# Patient Record
Sex: Female | Born: 1965 | Race: White | Hispanic: No | Marital: Married | Smoking: Never smoker
Health system: Southern US, Community
[De-identification: ages and names within clinical notes are randomized; demographics above are authoritative.]

## PROBLEM LIST (undated history)

## (undated) DIAGNOSIS — M1990 Unspecified osteoarthritis, unspecified site: Secondary | ICD-10-CM

## (undated) DIAGNOSIS — K219 Gastro-esophageal reflux disease without esophagitis: Secondary | ICD-10-CM

## (undated) DIAGNOSIS — J45909 Unspecified asthma, uncomplicated: Secondary | ICD-10-CM

## (undated) DIAGNOSIS — R51 Headache: Secondary | ICD-10-CM

## (undated) DIAGNOSIS — I10 Essential (primary) hypertension: Secondary | ICD-10-CM

## (undated) DIAGNOSIS — F419 Anxiety disorder, unspecified: Secondary | ICD-10-CM

## (undated) DIAGNOSIS — F329 Major depressive disorder, single episode, unspecified: Secondary | ICD-10-CM

## (undated) DIAGNOSIS — F32A Depression, unspecified: Secondary | ICD-10-CM

## (undated) HISTORY — PX: BREAST ENHANCEMENT SURGERY: SHX7

## (undated) HISTORY — PX: TONSILLECTOMY: SUR1361

## (undated) HISTORY — PX: DILATION AND CURETTAGE OF UTERUS: SHX78

## (undated) HISTORY — PX: COLONOSCOPY: SHX174

## (undated) HISTORY — PX: CLAVICLE SURGERY: SHX598

---

## 1997-08-17 ENCOUNTER — Inpatient Hospital Stay (HOSPITAL_COMMUNITY): Admission: AD | Admit: 1997-08-17 | Discharge: 1997-08-19 | Payer: Self-pay | Admitting: Obstetrics and Gynecology

## 1997-08-20 ENCOUNTER — Encounter: Admission: RE | Admit: 1997-08-20 | Discharge: 1997-11-18 | Payer: Self-pay | Admitting: Obstetrics and Gynecology

## 1997-10-04 ENCOUNTER — Other Ambulatory Visit: Admission: RE | Admit: 1997-10-04 | Discharge: 1997-10-04 | Payer: Self-pay | Admitting: Obstetrics and Gynecology

## 1998-10-10 ENCOUNTER — Other Ambulatory Visit: Admission: RE | Admit: 1998-10-10 | Discharge: 1998-10-10 | Payer: Self-pay | Admitting: Obstetrics and Gynecology

## 1999-05-25 ENCOUNTER — Ambulatory Visit (HOSPITAL_COMMUNITY): Admission: RE | Admit: 1999-05-25 | Discharge: 1999-05-25 | Payer: Self-pay | Admitting: Family Medicine

## 1999-05-25 ENCOUNTER — Encounter: Payer: Self-pay | Admitting: Family Medicine

## 1999-12-11 ENCOUNTER — Other Ambulatory Visit: Admission: RE | Admit: 1999-12-11 | Discharge: 1999-12-11 | Payer: Self-pay | Admitting: Obstetrics and Gynecology

## 2001-01-08 ENCOUNTER — Other Ambulatory Visit: Admission: RE | Admit: 2001-01-08 | Discharge: 2001-01-08 | Payer: Self-pay | Admitting: Obstetrics and Gynecology

## 2001-05-17 ENCOUNTER — Encounter (INDEPENDENT_AMBULATORY_CARE_PROVIDER_SITE_OTHER): Payer: Self-pay | Admitting: Specialist

## 2001-05-17 ENCOUNTER — Ambulatory Visit (HOSPITAL_COMMUNITY): Admission: RE | Admit: 2001-05-17 | Discharge: 2001-05-17 | Payer: Self-pay | Admitting: Gastroenterology

## 2001-09-30 ENCOUNTER — Encounter: Admission: RE | Admit: 2001-09-30 | Discharge: 2001-09-30 | Payer: Self-pay | Admitting: Family Medicine

## 2001-09-30 ENCOUNTER — Encounter: Payer: Self-pay | Admitting: Family Medicine

## 2002-03-11 ENCOUNTER — Other Ambulatory Visit: Admission: RE | Admit: 2002-03-11 | Discharge: 2002-03-11 | Payer: Self-pay | Admitting: Obstetrics and Gynecology

## 2003-04-11 ENCOUNTER — Other Ambulatory Visit: Admission: RE | Admit: 2003-04-11 | Discharge: 2003-04-11 | Payer: Self-pay | Admitting: Obstetrics and Gynecology

## 2005-01-24 ENCOUNTER — Other Ambulatory Visit: Admission: RE | Admit: 2005-01-24 | Discharge: 2005-01-24 | Payer: Self-pay | Admitting: Obstetrics and Gynecology

## 2005-04-04 ENCOUNTER — Encounter: Admission: RE | Admit: 2005-04-04 | Discharge: 2005-04-04 | Payer: Self-pay | Admitting: Family Medicine

## 2006-12-30 ENCOUNTER — Encounter: Admission: RE | Admit: 2006-12-30 | Discharge: 2006-12-30 | Payer: Self-pay | Admitting: Physician Assistant

## 2007-02-08 ENCOUNTER — Ambulatory Visit: Payer: Self-pay | Admitting: Internal Medicine

## 2007-02-08 LAB — CONVERTED CEMR LAB
BUN: 10 mg/dL (ref 6–23)
Basophils Absolute: 0.1 10*3/uL (ref 0.0–0.1)
Basophils Relative: 0.9 % (ref 0.0–1.0)
CO2: 26 meq/L (ref 19–32)
Calcium: 9 mg/dL (ref 8.4–10.5)
Chloride: 102 meq/L (ref 96–112)
Creatinine, Ser: 0.8 mg/dL (ref 0.4–1.2)
Eosinophils Absolute: 0.1 10*3/uL (ref 0.0–0.6)
Eosinophils Relative: 1 % (ref 0.0–5.0)
GFR calc Af Amer: 102 mL/min
GFR calc non Af Amer: 84 mL/min
Glucose, Bld: 91 mg/dL (ref 70–99)
HCT: 39.2 % (ref 36.0–46.0)
Hemoglobin: 13.8 g/dL (ref 12.0–15.0)
Lymphocytes Relative: 22.1 % (ref 12.0–46.0)
MCHC: 35.2 g/dL (ref 30.0–36.0)
MCV: 93 fL (ref 78.0–100.0)
Monocytes Absolute: 0.7 10*3/uL (ref 0.2–0.7)
Monocytes Relative: 5.9 % (ref 3.0–11.0)
Neutro Abs: 8.8 10*3/uL — ABNORMAL HIGH (ref 1.4–7.7)
Neutrophils Relative %: 70.1 % (ref 43.0–77.0)
Platelets: 321 10*3/uL (ref 150–400)
Potassium: 4.1 meq/L (ref 3.5–5.1)
RBC: 4.22 M/uL (ref 3.87–5.11)
RDW: 12 % (ref 11.5–14.6)
Sodium: 138 meq/L (ref 135–145)
TSH: 2.11 microintl units/mL (ref 0.35–5.50)
WBC: 12.4 10*3/uL — ABNORMAL HIGH (ref 4.5–10.5)

## 2007-02-09 ENCOUNTER — Ambulatory Visit: Payer: Self-pay | Admitting: Cardiology

## 2007-02-19 ENCOUNTER — Ambulatory Visit: Payer: Self-pay | Admitting: Pulmonary Disease

## 2007-02-19 ENCOUNTER — Ambulatory Visit: Admission: RE | Admit: 2007-02-19 | Discharge: 2007-02-19 | Payer: Self-pay | Admitting: Internal Medicine

## 2007-03-04 ENCOUNTER — Ambulatory Visit (HOSPITAL_COMMUNITY): Admission: RE | Admit: 2007-03-04 | Discharge: 2007-03-04 | Payer: Self-pay | Admitting: Internal Medicine

## 2007-03-18 ENCOUNTER — Ambulatory Visit: Payer: Self-pay | Admitting: Internal Medicine

## 2009-04-05 IMAGING — CR DG CHEST 2V
2 series · 2 of 2 positions shown · non-contrast
Comparison: None.

CLINICAL DATA: Shortness of breath with exertion for two months.  
 CHEST, TWO VIEWS:

[w chest pa]
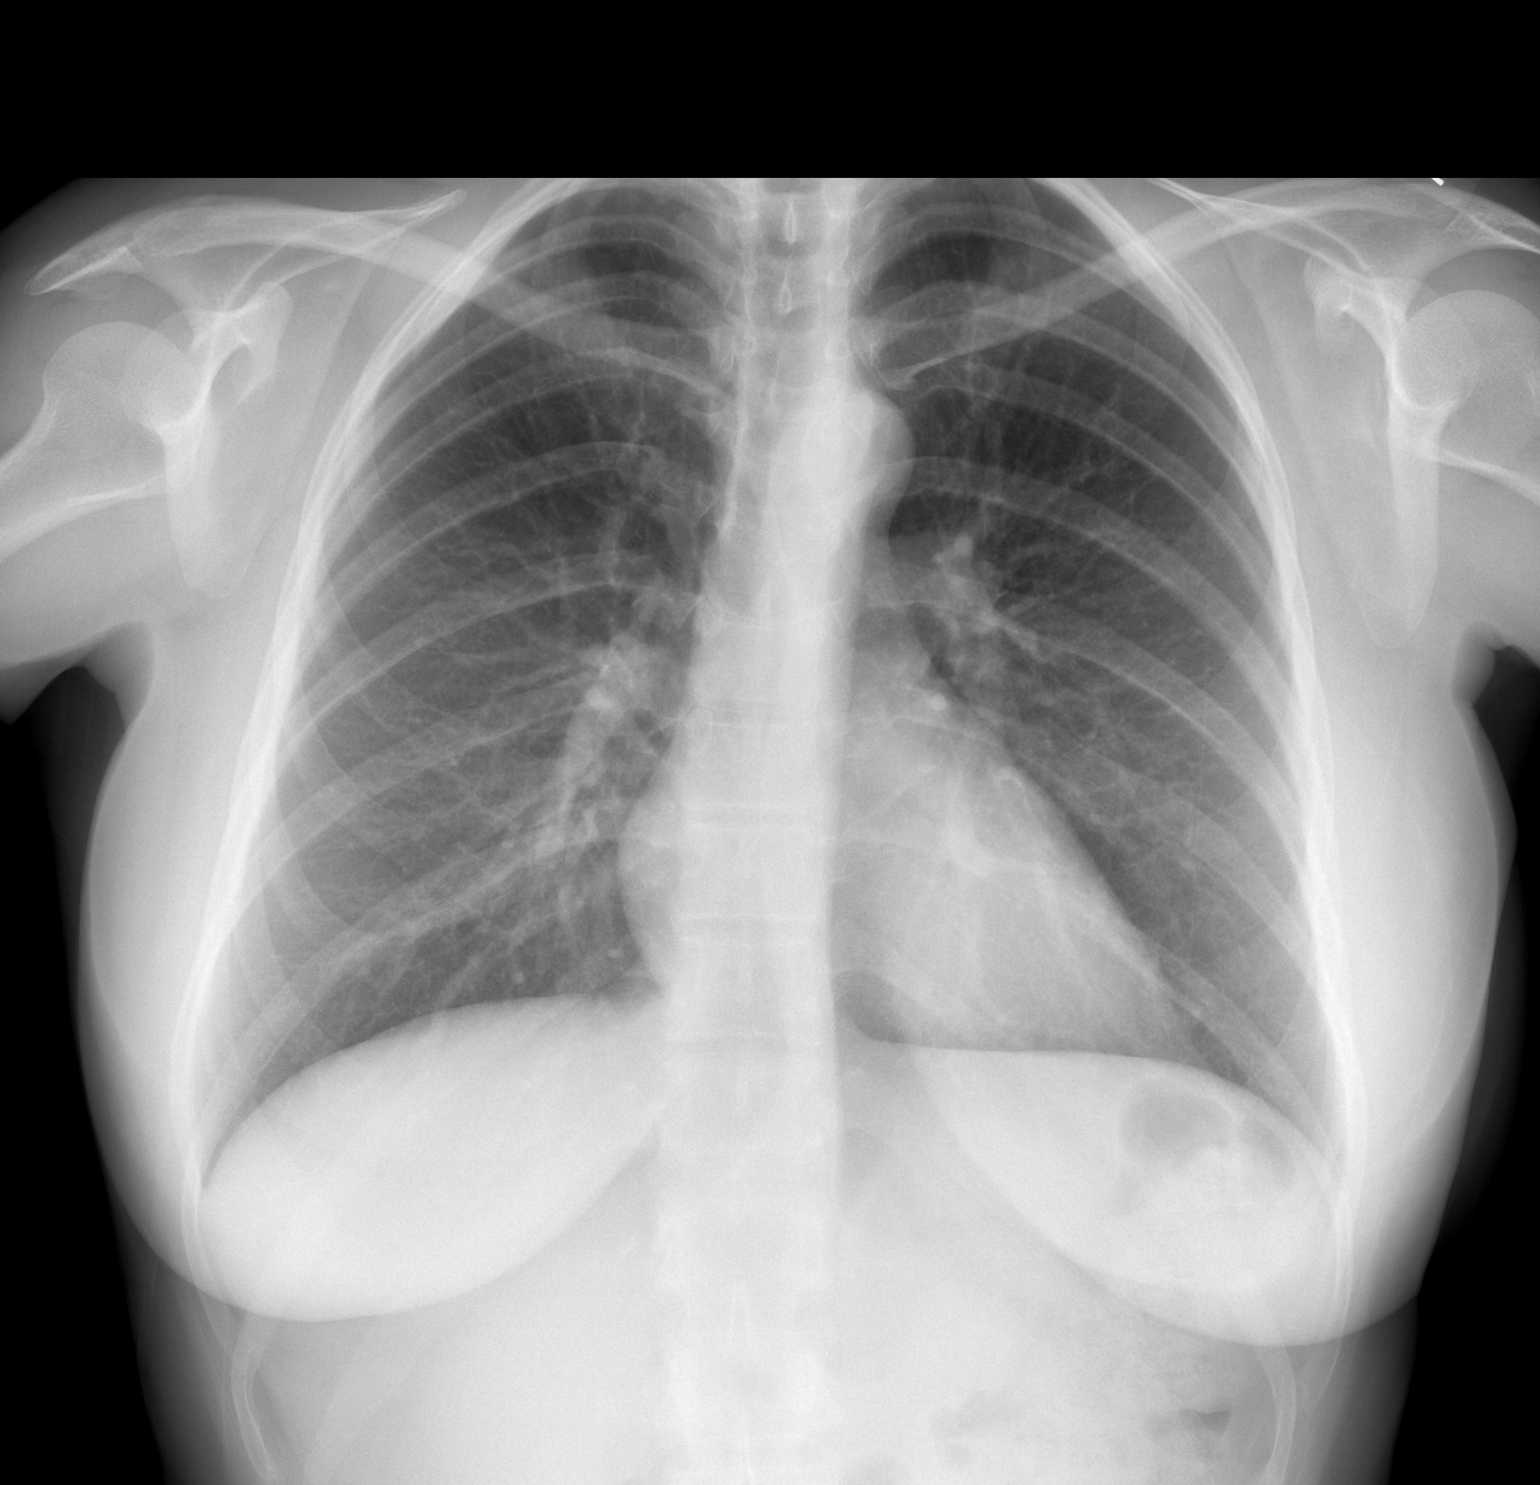

[w chest lat]
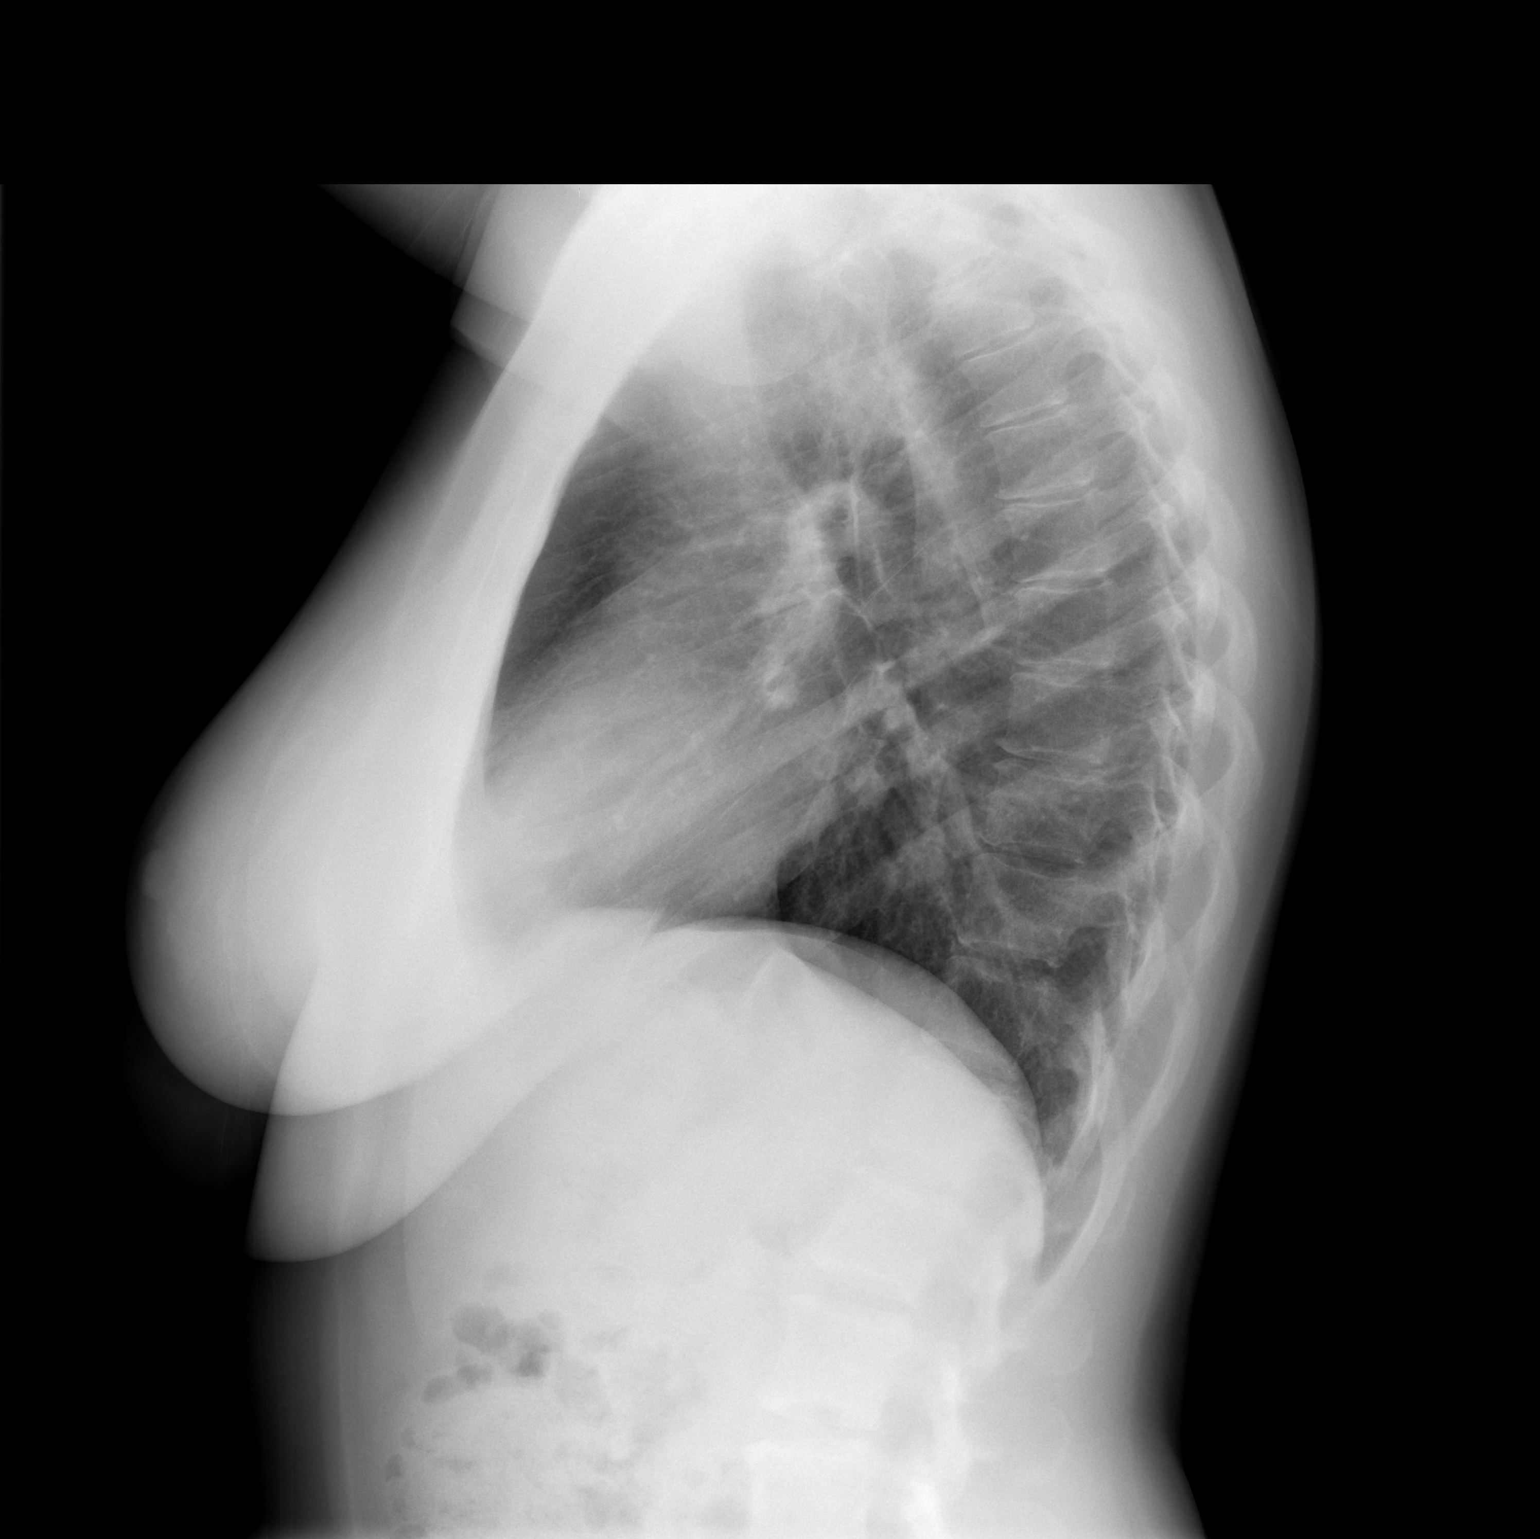

[2 of 2 positions shown; findings below may reference images not displayed]

FINDINGS: The heart size and mediastinal contours are unremarkable.  The lungs are clear.  The visualized skeleton is unremarkable.
IMPRESSION: No active disease.

## 2009-10-29 ENCOUNTER — Telehealth (INDEPENDENT_AMBULATORY_CARE_PROVIDER_SITE_OTHER): Payer: Self-pay | Admitting: *Deleted

## 2010-08-01 NOTE — Progress Notes (Signed)
  Phone Note Call from Patient   Caller: Patient Action Taken: Phone Call Completed Details of Action Taken: Printed EMR records Summary of Call: Patient called today requesting that we complete an [employer] for her by tomorrow. After speaking with Cleda Mccreedy advised the patient that [facility] would copy her records for her and Pam would certify that the copies are true and accurate.  Initial call taken by: Neal Dy, [facility] Representative

## 2010-11-12 NOTE — Assessment & Plan Note (Signed)
[facility]                             PULMONARY OFFICE NOTE   NAME:WEAVERAshleymarie, Sloan                       MRN:          [id number]  DATE:03/18/2007                            DOB:          [birth year]    HISTORY:  This is a 45 year old female who we initially saw for  progressive dyspea to the point where she couldn't get her breath  mopping the floor.  She tells me today she actually has paroxysms of  dyspnea that occur at rest as well, and that she has not slept for  years well, but not due to any breathing problems.  All of this is new  information to me today but had led to a workup initially directed at  the complaint of reproducible poor exercise tolerance for which we did  an exercise tolerance test which was perfectly normal.  However,  apparently it did not reproduce her symptoms that she can't get a deep  breath.  That led in turn to a methacholine challenge test that was  done off her propranolol and specific beta blockers which interestingly  was positive for decrease in airflow with methacholine but did not bring  on any of the symptoms that she has been experiencing.  It did make her  feel bad, however, and after she received albuterol she felt no  better until she went home and went to sleep.   She returns today despondent that we have not identified and explanation  for her chronic fatigue and dyspnea.  She can even be short of breath  going up steps, otherwise is on her feet all day without problems.  Note that the history has changed quite a bit.  Unfortunately so has the  list of medicines which now she tells me for the first time includes  Amitriptyllline at some dose at bedtime.  She is tapering bisoprolol  off because she does not think she needs it.   PHYSICAL EXAMINATION:  She is a depressed, anxious white female in no  acute distress.  Stable vital signs.  She has very unusual way of  answering questions without actually  answering them.  HEENT:  Unremarkable.  LUNGS:  Clear bilateral to percussion.  CARDIAC:  Reveals a regular rhythm without murmurs, rubs or gallops.  ABDOMEN:  Soft.  EXTREMITIES:  Warm, without calf tenderness, sinus, or clubbing.   IMPRESSION:  1. She has excellent exercise tolerance which excludes cardiac or      pulmonary dysfunction from explaining any of her dyspnea going up      steps, or mopping the floor.  I believe this is functional and      related to deconditioning as well as poor sleep hygiene and I      recommended not only a sleep hygiene sheet (which I reviewed with      her), but also the concept that she should exercise at a level      where is short of breath but not out of breath for 2 full weeks      before considering any additional workup because  this may actually      solve her problem.  2. The paroxysms of dyspnea at rest initially might indicate asthma,      but note that we were not able to reproduce her symptoms with a      methacholine challenge test, even though technically it was      positive.  This means that she is susceptible to asthma and should      not be placed on high doses of beta blockers, start smoking      cigarettes or have significant allergy exposures.  Since none of      these apply, I do not believe she needs to be treated for asthma,      but I certainly would not use high doses of nonspecific beta      blockers here (ie propranolol is contraindicated).   I do not believe the patient was happy with the evaluation that we have  done and have referred her back to Dr. Clovis Riley for further evaluation  of chronic fatigue, noting that she has a definite problem with sleep  hygiene, and until she solves this it is unlikely that the daytime  function will improve.   Parenthetically, I would also note that we did a full lab profile  including TSH, CBC, chemistry profile that were normal on her initial  workup.     Charlaine Dalton. Sherene Sires, MD,  [facility]  Electronically Signed    MBW/MedQ  DD: 03/18/2007  DT: 03/18/2007  Job #: [id number]   cc:   L. Lupe Carney, M.D.

## 2010-11-12 NOTE — Op Note (Signed)
NAME:  Victoria Sloan, Victoria Sloan NO.:  [id number]   MEDICAL RECORD NO.:  [id number]          PATIENT TYPE:  OUT   LOCATION:  CARD                         FACILITY:  [facility]   PHYSICIAN:  Oley Balm. Sung Amabile, MD   DATE OF BIRTH:  [birth year]   DATE OF PROCEDURE:  02/19/2007  DATE OF DISCHARGE:  02/19/2007                               OPERATIVE REPORT   CARDIOPULMONARY STRESS TEST:   INDICATION FOR TESTING:  Unexplained dyspnea.   PROCEDURE:  Cardiopulmonary stress testing was performed on a graded  treadmill.  Testing was stopped due to fatigue and head and neck pain.  Effort was maximal.  At peak exercise oxygen uptake was 1.72 L/min. or  85% of predicted maximum, indicating that indicating low normal exercise  tolerance.   At peak exercise heart rate was 160 beats per minute or 89% of  predicted, indicating that cardiovascular limitation was reached.  Oxygen pulse was normal, suggesting normal stroke volume.  Blood  pressure response was normal.  EKG tracings revealed no arrhythmias and  no definite ischemia.   At peak exercise minute ventilation was 66.9 L/min. or 64% of predicted  maximum, indicating that ventilatory reserve remained.  Gas exchange  parameters revealed no abnormalities.  Baseline spirometry revealed no  abnormalities.  Postexercise spirometry revealed an 18% reduction in FEF  25-75%, consistent with possible mild exercise-induced bronchospasm.   SUMMARY:  Low normal exercise tolerance.  Normal cardiopulmonary  response to exercise.  Possible mild exercise-induced bronchospasm.  Consider a trial of bronchodilators if one has not already been  undertaken.      Oley Balm Sung Amabile, MD  Electronically Signed     DBS/MEDQ  D:  03/16/2007  T:  03/17/2007  Job:  [id number]   cc:   Charlaine Dalton. Sherene Sires, MD, FCCP  [address]  [place] [address]   [facility]

## 2010-11-12 NOTE — Assessment & Plan Note (Signed)
[facility]                             PULMONARY OFFICE NOTE   NAME:WEAVERPrapti, Grussing                       MRN:          [id number]  DATE:02/08/2007                            DOB:          04-24-1966    HISTORY:  A 45 year old white female, occasional social smoker only,  with new-onset dyspnea over the last 3 to 4 months, mostly occurring  with exertion, gradually worse to the point where she cannot mop a floor  any more.  She was tried on Advair, which she said helped some,  Singulair that did not help at all.  She has noticed mild nasal  congestion with these symptoms, but no pleuritic pain, fevers, chills,  sweats, orthopnea, PND, or leg swelling.  Denies any symptoms have ever  occurred with sleeping.  She does have occasional sensation that she  just can't get a deep breath, but this is terminated after a single  breath with relaxing.  Typically, she recovers within 1 to 2 minutes of  sitting still.  Denies any subjective wheezing.  No history of DVT or  calf injury, or swelling.   PAST MEDICAL HISTORY:  Significant for normal child birth x2, the last 9  months ago.  Hypertension.  Migraines.   ALLERGIES:  None known.   MEDICATIONS:  Effexor.  Birth control pills.  Propranolol for both  migraines and headache.  She did have recent antibiotics, does not think  it was Macrobid.   SOCIAL HISTORY:  She smoked only socially, but quit 2 to 3 years ago and  actually never smoked much at all.  She manages a [employer], political [employer].   FAMILY HISTORY:  Negative for clotting disorders or asthma.   REVIEW OF SYSTEMS:  Taken in detail on the worksheet and negative,  except as outlined above.   PHYSICAL EXAMINATION:  This is a very pleasant, ambulatory, minimally  obese white female in no acute distress with relatively thin  extremities.  Weight is 144 pounds.  HEENT:  Unremarkable.  Oropharynx clear.  NECK:  Supple without cervical adenopathy or tenderness.   Trachea is  midline with no thyromegaly.  LUNGS:  The lung fields are perfectly clear bilaterally to auscultation  and percussion.  CARDIAC:  Regular rate and rhythm without murmur, gallop, or rub.  She  had a regular rate and rhythm with no increase in the pulmonic component  of S2.  ABDOMEN:  Soft and benign.  EXTREMITIES:  Warm without calf tenderness, cyanosis, clubbing, or  edema.   LABS:  She walked around the office for 1 lap, 185 feet, before  desaturating to 88% with a pulse rate of 104.   Chest x-ray was felt to be normal.   IMPRESSION:  New onset exertional dyspnea associated with hypoxemia  indicating either occult thromboembolic disease or interstitial lung  disease.  Note, she has had a history of urinary tract infection, but  denies ever being exposed to Macrobid.  She is on birth control pills at  age 49 and, therefore, occult thromboembolic disease would be a leading  suspect in the differential diagnosis.  I, therefore, recommended  proceeding as soon as possible with a spiral CT and also will obtain a  baseline CBC with differential and D-dimer today, as well as TSH to  workup unexplained dyspnea, and BMET (for bicarb level) to evaluate  unexplained dyspnea on exertion.   Propranolol could be causing her to be asthmatic, but that would not  cause her to desaturate.  I have recommended a substitute Bystolic 5 mg  daily and perform a methacholine challenge test only after she has  proven to have a normal CT scan of the chest.   She tells me that her migraines have been bad off and on propranolol, so  it may well be that an alternative choice form propranolol could be  considered, but I have told her it may well be that propranolol is  helping her headaches, and that she will have a flare-up off of Inderal  and on Bystolic 5 mg daily (the reason to start the Bystolic now is so  that the methacholine challenge test is not falsely positive because of  the  nonspecific beta blocker of propranolol).     Charlaine Dalton. Sherene Sires, MD, [facility]  Electronically Signed    MBW/MedQ  DD: 02/08/2007  DT: 02/09/2007  Job #: [id number]   cc:   L. Lupe Carney, M.D.  Milus Height, MD  [facility]. Marcelle Overlie, M.D.

## 2010-11-15 NOTE — Procedures (Signed)
[place]  Patient:    NOELLY, LASSEIGNE Visit Number: [id number] MRN: [id number]          Service Type: END Location: ENDO Attending Physician:  Rich Brave Dictated by:   Florencia Reasons, M.D. Proc. Date: 05/17/01 Admit Date:  05/17/2001   CC:         [facility] Salvia. Marcelle Overlie, M.D.   Procedure Report  PROCEDURE:  Colonoscopy with polypectomy.  INDICATIONS FOR PROCEDURE:  A 45 year old female with family history of colon cancer in her mother at age 58, also family history of colon cancer in a maternal uncle.  FINDINGS:  Small polyp removed from the hepatic flexure region.  CONSENT:  The nature, purpose and risks of the procedure had been discussed with the patient who provided written consent.  SEDATION:  Phentanyl 145 mcg and Versed 15 mg IV without arrhythmias or desaturation.  DESCRIPTION OF PROCEDURE:  The Olympus adjustable tension pediatric video colonoscope was advanced with slight difficulty due to looping, but once the loop was removed, it advanced quite easily.  The remainder of the distance to the cecum, as identified by visualization of the appendiceal orifice, and pull back was then performed.  On the way in, I encountered a sessile 3 x 6 mm polyp which I biopsied a couple of times and then snared off.  When completed, most if not all of the tissue appeared to have been devitalized.  Only scant fragments were retrieved for histologic analysis, however, since it appeared the remainder of the tissue had been essentially fulgurated away.  No other polyps were seen during this exam, and there was no evidence of cancer, colitis, vascular malformations, or diverticular disease. Retroflexion was not performed in the rectum due to a small rectal ampulla. The quality of the prep was very good, except in the cecum itself where there was a stool film which was quite adherent and coated most of the cecal surface area, but it  is not felt that any significant lesions would have been missed.  The patient tolerated the procedure well, and there were no apparent complications.  IMPRESSION:  Small polyp near hepatic flexure, otherwise unremarkable exam.  PLAN:  Await pathology. Dictated by:   Florencia Reasons, M.D. Attending Physician:  Rich Brave DD:  05/17/01 TD:  05/17/01 Job: [id number]

## 2012-03-16 ENCOUNTER — Encounter (HOSPITAL_BASED_OUTPATIENT_CLINIC_OR_DEPARTMENT_OTHER): Payer: Self-pay | Admitting: *Deleted

## 2012-03-16 NOTE — Progress Notes (Signed)
To come in for bmet and ekg-saw dr wert years ago for ? Exercise induced asthma-states no problems anymore

## 2012-03-17 ENCOUNTER — Encounter (HOSPITAL_BASED_OUTPATIENT_CLINIC_OR_DEPARTMENT_OTHER)
Admission: RE | Admit: 2012-03-17 | Discharge: 2012-03-17 | Disposition: A | Discharge status: Home / Self Care | Payer: Medicaid Other | Source: Ambulatory Visit | Attending: Orthopedic Surgery | Admitting: Orthopedic Surgery

## 2012-03-17 LAB — BASIC METABOLIC PANEL
BUN: 13 mg/dL (ref 6–23)
Chloride: 99 mEq/L (ref 96–112)
Glucose, Bld: 92 mg/dL (ref 70–99)
Potassium: 3.8 mEq/L (ref 3.5–5.1)

## 2012-03-19 ENCOUNTER — Ambulatory Visit (HOSPITAL_BASED_OUTPATIENT_CLINIC_OR_DEPARTMENT_OTHER)
Admission: RE | Admit: 2012-03-19 | Discharge: 2012-03-19 | Disposition: A | Discharge status: Home / Self Care | Payer: Medicaid Other | Source: Ambulatory Visit | Attending: Orthopedic Surgery | Admitting: Orthopedic Surgery

## 2012-03-19 ENCOUNTER — Encounter (HOSPITAL_BASED_OUTPATIENT_CLINIC_OR_DEPARTMENT_OTHER): Payer: Self-pay | Admitting: *Deleted

## 2012-03-19 ENCOUNTER — Encounter (HOSPITAL_BASED_OUTPATIENT_CLINIC_OR_DEPARTMENT_OTHER): Payer: Self-pay | Admitting: Anesthesiology

## 2012-03-19 ENCOUNTER — Encounter (HOSPITAL_BASED_OUTPATIENT_CLINIC_OR_DEPARTMENT_OTHER)
Admission: RE | Disposition: A | Discharge status: Home / Self Care | Payer: Self-pay | Source: Ambulatory Visit | Attending: Orthopedic Surgery

## 2012-03-19 ENCOUNTER — Ambulatory Visit (HOSPITAL_BASED_OUTPATIENT_CLINIC_OR_DEPARTMENT_OTHER): Payer: Medicaid Other | Admitting: Anesthesiology

## 2012-03-19 DIAGNOSIS — Z01812 Encounter for preprocedural laboratory examination: Secondary | ICD-10-CM | POA: Insufficient documentation

## 2012-03-19 DIAGNOSIS — M19019 Primary osteoarthritis, unspecified shoulder: Secondary | ICD-10-CM | POA: Insufficient documentation

## 2012-03-19 DIAGNOSIS — K219 Gastro-esophageal reflux disease without esophagitis: Secondary | ICD-10-CM | POA: Insufficient documentation

## 2012-03-19 DIAGNOSIS — M25819 Other specified joint disorders, unspecified shoulder: Secondary | ICD-10-CM | POA: Insufficient documentation

## 2012-03-19 DIAGNOSIS — I10 Essential (primary) hypertension: Secondary | ICD-10-CM | POA: Insufficient documentation

## 2012-03-19 DIAGNOSIS — J45909 Unspecified asthma, uncomplicated: Secondary | ICD-10-CM | POA: Insufficient documentation

## 2012-03-19 HISTORY — DX: Unspecified osteoarthritis, unspecified site: M19.90

## 2012-03-19 HISTORY — PX: SHOULDER ARTHROSCOPY: SHX128

## 2012-03-19 HISTORY — DX: Unspecified asthma, uncomplicated: J45.909

## 2012-03-19 HISTORY — DX: Gastro-esophageal reflux disease without esophagitis: K21.9

## 2012-03-19 HISTORY — DX: Depression, unspecified: F32.A

## 2012-03-19 HISTORY — DX: Headache: R51

## 2012-03-19 HISTORY — DX: Major depressive disorder, single episode, unspecified: F32.9

## 2012-03-19 HISTORY — DX: Essential (primary) hypertension: I10

## 2012-03-19 HISTORY — DX: Anxiety disorder, unspecified: F41.9

## 2012-03-19 SURGERY — ARTHROSCOPY, SHOULDER
Anesthesia: General | Site: Shoulder | Laterality: Left | Wound class: Clean

## 2012-03-19 MED ORDER — MEPERIDINE HCL 25 MG/ML IJ SOLN: 6.2500 mg | INTRAMUSCULAR | Status: DC | PRN

## 2012-03-19 MED ORDER — SUCCINYLCHOLINE CHLORIDE 20 MG/ML IJ SOLN
INTRAMUSCULAR | Status: DC | PRN
  Administered 2012-03-19: 100 mg via INTRAVENOUS

## 2012-03-19 MED ORDER — OXYCODONE HCL 5 MG PO TABS
5.0000 mg | ORAL_TABLET | Freq: Once | ORAL | Status: AC | PRN
  Administered 2012-03-19: 5 mg via ORAL

## 2012-03-19 MED ORDER — DEXAMETHASONE SODIUM PHOSPHATE 4 MG/ML IJ SOLN
INTRAMUSCULAR | Status: DC | PRN
  Administered 2012-03-19: 10 mg via INTRAVENOUS

## 2012-03-19 MED ORDER — ROPIVACAINE HCL 5 MG/ML IJ SOLN
INTRAMUSCULAR | Status: DC | PRN
  Administered 2012-03-19: 25 mL via EPIDURAL

## 2012-03-19 MED ORDER — SODIUM CHLORIDE 0.9 % IR SOLN
Status: DC | PRN
  Administered 2012-03-19: 3000 mL

## 2012-03-19 MED ORDER — FENTANYL CITRATE 0.05 MG/ML IJ SOLN
50.0000 ug | Freq: Once | INTRAMUSCULAR | Status: AC
  Administered 2012-03-19: 100 ug via INTRAVENOUS

## 2012-03-19 MED ORDER — KETOROLAC TROMETHAMINE 30 MG/ML IJ SOLN
INTRAMUSCULAR | Status: DC | PRN
  Administered 2012-03-19: 30 mg via INTRAVENOUS

## 2012-03-19 MED ORDER — LIDOCAINE HCL (CARDIAC) 10 MG/ML IV SOLN
INTRAVENOUS | Status: DC | PRN
  Administered 2012-03-19: 50 mg via INTRAVENOUS

## 2012-03-19 MED ORDER — OXYCODONE HCL 5 MG/5ML PO SOLN: 5.0000 mg | Freq: Once | ORAL | Status: AC | PRN

## 2012-03-19 MED ORDER — MIDAZOLAM HCL 2 MG/2ML IJ SOLN
1.0000 mg | INTRAMUSCULAR | Status: DC | PRN
  Administered 2012-03-19: 2 mg via INTRAVENOUS

## 2012-03-19 MED ORDER — PROMETHAZINE HCL 25 MG/ML IJ SOLN: 6.2500 mg | INTRAMUSCULAR | Status: DC | PRN

## 2012-03-19 MED ORDER — LACTATED RINGERS IV SOLN
INTRAVENOUS | Status: DC
  Administered 2012-03-19 (×2): via INTRAVENOUS

## 2012-03-19 MED ORDER — OXYCODONE-ACETAMINOPHEN 5-325 MG PO TABS: 1.0000 | ORAL_TABLET | Freq: Four times a day (QID) | ORAL | Status: DC | PRN

## 2012-03-19 MED ORDER — PROPOFOL 10 MG/ML IV BOLUS
INTRAVENOUS | Status: DC | PRN
  Administered 2012-03-19: 200 mg via INTRAVENOUS
  Administered 2012-03-19: 20 mg via INTRAVENOUS

## 2012-03-19 MED ORDER — CEFAZOLIN SODIUM 1-5 GM-% IV SOLN
INTRAVENOUS | Status: DC | PRN
  Administered 2012-03-19: 2 g via INTRAVENOUS

## 2012-03-19 MED ORDER — HYDROMORPHONE HCL PF 1 MG/ML IJ SOLN: 0.2500 mg | INTRAMUSCULAR | Status: DC | PRN

## 2012-03-19 SURGICAL SUPPLY — 72 items
BENZOIN TINCTURE PRP APPL 2/3 (GAUZE/BANDAGES/DRESSINGS) IMPLANT
BLADE SURG 15 STRL LF DISP TIS (BLADE) IMPLANT
BLADE SURG 15 STRL SS (BLADE)
BLADE VORTEX 6.0 (BLADE) ×2 IMPLANT
CANISTER OMNI JUG 16 LITER (MISCELLANEOUS) ×2 IMPLANT
CANISTER SUCTION 2500CC (MISCELLANEOUS) IMPLANT
CANNULA 5.75X71 LONG (CANNULA) IMPLANT
CANNULA TWIST IN 8.25X7CM (CANNULA) IMPLANT
CLOTH BEACON ORANGE TIMEOUT ST (SAFETY) ×2 IMPLANT
CUTTER MENISCUS  4.2MM (BLADE) ×1
CUTTER MENISCUS 4.2MM (BLADE) ×1 IMPLANT
DECANTER SPIKE VIAL GLASS SM (MISCELLANEOUS) IMPLANT
DRAPE INCISE IOBAN 66X45 STRL (DRAPES) ×2 IMPLANT
DRAPE STERI 35X30 U-POUCH (DRAPES) ×2 IMPLANT
DRAPE SURG 17X23 STRL (DRAPES) ×2 IMPLANT
DRAPE U-SHAPE 47X51 STRL (DRAPES) ×2 IMPLANT
DRAPE U-SHAPE 76X120 STRL (DRAPES) ×4 IMPLANT
DRSG EMULSION OIL 3X3 NADH (GAUZE/BANDAGES/DRESSINGS) ×2 IMPLANT
DRSG PAD ABDOMINAL 8X10 ST (GAUZE/BANDAGES/DRESSINGS) ×4 IMPLANT
DURAPREP 26ML APPLICATOR (WOUND CARE) ×2 IMPLANT
ELECT REM PT RETURN 9FT ADLT (ELECTROSURGICAL) ×2
ELECTRODE REM PT RTRN 9FT ADLT (ELECTROSURGICAL) ×1 IMPLANT
GLOVE BIO SURGEON STRL SZ 6.5 (GLOVE) ×2 IMPLANT
GLOVE BIOGEL PI IND STRL 7.0 (GLOVE) ×1 IMPLANT
GLOVE BIOGEL PI IND STRL 8 (GLOVE) ×2 IMPLANT
GLOVE BIOGEL PI INDICATOR 7.0 (GLOVE) ×1
GLOVE BIOGEL PI INDICATOR 8 (GLOVE) ×2
GLOVE ECLIPSE 7.5 STRL STRAW (GLOVE) ×4 IMPLANT
GOWN BRE IMP PREV XXLGXLNG (GOWN DISPOSABLE) ×2 IMPLANT
GOWN PREVENTION PLUS XLARGE (GOWN DISPOSABLE) ×2 IMPLANT
GOWN PREVENTION PLUS XXLARGE (GOWN DISPOSABLE) ×4 IMPLANT
NDL SUT 6 .5 CRC .975X.05 MAYO (NEEDLE) IMPLANT
NEEDLE 1/2 CIR CATGUT .05X1.09 (NEEDLE) IMPLANT
NEEDLE HYPO 18GX1.5 BLUNT FILL (NEEDLE) IMPLANT
NEEDLE MAYO TAPER (NEEDLE)
NEEDLE SCORPION MULTI FIRE (NEEDLE) IMPLANT
NS IRRIG 1000ML POUR BTL (IV SOLUTION) IMPLANT
PACK ARTHROSCOPY DSU (CUSTOM PROCEDURE TRAY) ×2 IMPLANT
PACK BASIN DAY SURGERY FS (CUSTOM PROCEDURE TRAY) ×2 IMPLANT
PASSER SUT SWANSON 36MM LOOP (INSTRUMENTS) IMPLANT
PENCIL BUTTON HOLSTER BLD 10FT (ELECTRODE) IMPLANT
SET IRRIG Y TYPE TUR BLADDER L (SET/KITS/TRAYS/PACK) ×2 IMPLANT
SLEEVE SCD COMPRESS KNEE MED (MISCELLANEOUS) ×2 IMPLANT
SLING ARM FOAM STRAP LRG (SOFTGOODS) ×2 IMPLANT
SLING ARM FOAM STRAP MED (SOFTGOODS) IMPLANT
SLING ARM FOAM STRAP XLG (SOFTGOODS) IMPLANT
SLING ARM IMMOBILIZER LRG (SOFTGOODS) IMPLANT
SPONGE GAUZE 4X4 12PLY (GAUZE/BANDAGES/DRESSINGS) ×2 IMPLANT
SPONGE LAP 4X18 X RAY DECT (DISPOSABLE) IMPLANT
STRIP CLOSURE SKIN 1/2X4 (GAUZE/BANDAGES/DRESSINGS) IMPLANT
SUCTION FRAZIER TIP 10 FR DISP (SUCTIONS) IMPLANT
SUT ETHIBOND 2 OS 4 DA (SUTURE) IMPLANT
SUT ETHILON 4 0 PS 2 18 (SUTURE) ×2 IMPLANT
SUT MNCRL AB 3-0 PS2 18 (SUTURE) IMPLANT
SUT PDS AB 0 CT 36 (SUTURE) IMPLANT
SUT PROLENE 3 0 PS 2 (SUTURE) IMPLANT
SUT TICRON 1 T 12 (SUTURE) IMPLANT
SUT TIGER TAPE 7 IN WHITE (SUTURE) IMPLANT
SUT VIC AB 0 CT1 27 (SUTURE)
SUT VIC AB 0 CT1 27XBRD ANBCTR (SUTURE) IMPLANT
SUT VIC AB 1 CT1 27 (SUTURE)
SUT VIC AB 1 CT1 27XBRD ANBCTR (SUTURE) IMPLANT
SUT VIC AB 2-0 SH 27 (SUTURE)
SUT VIC AB 2-0 SH 27XBRD (SUTURE) IMPLANT
SYR 5ML LL (SYRINGE) IMPLANT
TAPE FIBER 2MM 7IN #2 BLUE (SUTURE) IMPLANT
TOWEL OR 17X24 6PK STRL BLUE (TOWEL DISPOSABLE) ×2 IMPLANT
TOWEL OR NON WOVEN STRL DISP B (DISPOSABLE) ×2 IMPLANT
TUBE CONNECTING 20X1/4 (TUBING) IMPLANT
WAND STAR VAC 90 (SURGICAL WAND) ×2 IMPLANT
WATER STERILE IRR 1000ML POUR (IV SOLUTION) ×2 IMPLANT
YANKAUER SUCT BULB TIP NO VENT (SUCTIONS) IMPLANT

## 2012-03-19 NOTE — Brief Op Note (Signed)
03/19/2012  3:03 PM  PATIENT:  Victoria Sloan  46 y.o. female  PRE-OPERATIVE DIAGNOSIS:  Acromioclavicular JOINT ARTHRITIS left shoulder  POST-OPERATIVE DIAGNOSIS:  same as preop  PROCEDURE:  Procedure(s) (LRB) with comments: ARTHROSCOPY SHOULDER (Left) - left shoulder arthroscopy with distal clavicle resection  SURGEON:  Surgeon(s) and Role:    * Harvie Junior, MD - Primary  PHYSICIAN ASSISTANT:   ASSISTANTS: bethune   ANESTHESIA:   general  EBL:  Total I/O In: 1400 [I.V.:1400] Out: -   BLOOD ADMINISTERED:none  DRAINS: none   LOCAL MEDICATIONS USED:  MARCAINE     SPECIMEN:  No Specimen  DISPOSITION OF SPECIMEN:  N/A  COUNTS:  YES  TOURNIQUET:  * No tourniquets in log *  DICTATION: .Other Dictation: Dictation Number [id number]  PLAN OF CARE: Discharge to home after PACU  PATIENT DISPOSITION:  PACU - hemodynamically stable.   Delay start of Pharmacological VTE agent (>24hrs) due to surgical blood loss or risk of bleeding: not applicable

## 2012-03-19 NOTE — Anesthesia Procedure Notes (Addendum)
Anesthesia Regional Block:  Interscalene brachial plexus block  Pre-Anesthetic Checklist: ,, timeout performed, Correct Patient, Correct Site, Correct Laterality, Correct Procedure, Correct Position, site marked, Risks and benefits discussed, Surgical consent,  Pre-op evaluation,  Post-op pain management  Laterality: Left and Upper  Prep: chloraprep       Needles:   Needle Type: Echogenic Needle      Needle Gauge: 22 and 22 G  Needle insertion depth: 3 cm   Additional Needles:  Procedures: ultrasound guided Interscalene brachial plexus block Narrative:  Start time: 03/19/2012 12:52 PM End time: 03/19/2012 1:07 PM  Performed by: Personally  Anesthesiologist: T Massagee  Additional Notes: Tolerated well   Procedure Name: Intubation Date/Time: 03/19/2012 1:35 PM Performed by: Gar Gibbon Pre-anesthesia Checklist: Patient identified, Emergency Drugs available, Suction available and Patient being monitored Patient Re-evaluated:Patient Re-evaluated prior to inductionOxygen Delivery Method: Circle System Utilized Preoxygenation: Pre-oxygenation with 100% oxygen Intubation Type: IV induction Ventilation: Mask ventilation without difficulty Laryngoscope Size: Miller and 2 Grade View: Grade II Tube type: Oral Tube size: 7.0 mm Number of attempts: 1 Airway Equipment and Method: stylet and oral airway Placement Confirmation: ETT inserted through vocal cords under direct vision,  positive ETCO2 and breath sounds checked- equal and bilateral Secured at: 21 cm Tube secured with: Tape Dental Injury: Teeth and Oropharynx as per pre-operative assessment

## 2012-03-19 NOTE — Anesthesia Preprocedure Evaluation (Signed)
Anesthesia Evaluation  Patient identified by MRN, date of birth, ID band Patient awake    History of Anesthesia Complications Negative for: history of anesthetic complications  Airway Mallampati: I  Neck ROM: Full    Dental  (+) Teeth Intact   Pulmonary asthma ,  breath sounds clear to auscultation        Cardiovascular hypertension, Rhythm:Regular Rate:Normal     Neuro/Psych  Headaches,    GI/Hepatic Neg liver ROS, GERD-  ,  Endo/Other  negative endocrine ROS  Renal/GU negative Renal ROS     Musculoskeletal  (+) Arthritis -, Fibromyalgia -  Abdominal   Peds  Hematology negative hematology ROS (+)   Anesthesia Other Findings   Reproductive/Obstetrics                           Anesthesia Physical Anesthesia Plan  ASA: II  Anesthesia Plan: General   Post-op Pain Management:    Induction: Intravenous  Airway Management Planned: Oral ETT  Additional Equipment:   Intra-op Plan:   Post-operative Plan: Extubation in OR  Informed Consent: I have reviewed the patients History and Physical, chart, labs and discussed the procedure including the risks, benefits and alternatives for the proposed anesthesia with the patient or authorized representative who has indicated his/her understanding and acceptance.   Dental advisory given  Plan Discussed with: CRNA  Anesthesia Plan Comments:         Anesthesia Quick Evaluation

## 2012-03-19 NOTE — Anesthesia Postprocedure Evaluation (Signed)
  Anesthesia Post-op Note  Patient: Victoria Sloan  Procedure(s) Performed: Procedure(s) (LRB) with comments: ARTHROSCOPY SHOULDER (Left) - left shoulder arthroscopy with distal clavicle resection  Patient Location: PACU  Anesthesia Type: General and GA combined with regional for post-op pain  Level of Consciousness: awake  Airway and Oxygen Therapy: Patient Spontanous Breathing  Post-op Pain: mild  Post-op Assessment: Post-op Vital signs reviewed  Post-op Vital Signs: stable  Complications: No apparent anesthesia complications

## 2012-03-19 NOTE — Transfer of Care (Signed)
Immediate Anesthesia Transfer of Care Note  Patient: Victoria Sloan  Procedure(s) Performed: Procedure(s) (LRB) with comments: ARTHROSCOPY SHOULDER (Left) - left shoulder arthroscopy with distal clavicle resection  Patient Location: PACU  Anesthesia Type: GA combined with regional for post-op pain  Level of Consciousness: awake, alert  and patient cooperative  Airway & Oxygen Therapy: Patient Spontanous Breathing and Patient connected to face mask oxygen  Post-op Assessment: Report given to PACU RN and Post -op Vital signs reviewed and stable  Post vital signs: Reviewed and stable  Complications: No apparent anesthesia complications

## 2012-03-19 NOTE — Progress Notes (Signed)
Assisted Dr. Massagee with left, ultrasound guided, interscalene  block. Side rails up, monitors on throughout procedure. See vital signs in flow sheet. Tolerated Procedure well. 

## 2012-03-19 NOTE — H&P (Signed)
PREOPERATIVE H&P  Chief Complaint: l shoulder pain  HPI: Victoria Sloan is a 46 y.o. female who presents for evaluation of l shoulder pain. It has been present for greater than 6 months and has been worsening. She has failed conservative measures. Pain is rated as moderate.  Past Medical History  Diagnosis Date  . Headache   . Arthritis   . Hypertension   . Depression   . Anxiety   . Asthma   . GERD (gastroesophageal reflux disease)    Past Surgical History  Procedure Date  . Colonoscopy   . Tonsillectomy   . Breast enhancement surgery   . Dilation and curettage of uterus    History   Social History  . Marital Status: Divorced    Spouse Name: N/A    Number of Children: N/A  . Years of Education: N/A   Social History Main Topics  . Smoking status: Never Smoker   . Smokeless tobacco: None  . Alcohol Use: Yes  . Drug Use: No  . Sexually Active:    Other Topics Concern  . None   Social History Narrative  . None   History reviewed. No pertinent family history. No Known Allergies Prior to Admission medications   Medication Sig Start Date End Date Taking? Authorizing Provider  ALPRAZolam Prudy Feeler) 0.5 MG tablet Take 0.5 mg by mouth at bedtime as needed.   Yes Historical Provider, MD  amitriptyline (ELAVIL) 50 MG tablet Take 50 mg by mouth at bedtime.   Yes Historical Provider, MD  etodolac (LODINE) 400 MG tablet Take 400 mg by mouth at bedtime.   Yes Historical Provider, MD  lisinopril (PRINIVIL,ZESTRIL) 10 MG tablet Take 10 mg by mouth daily.   Yes Historical Provider, MD  promethazine (PHENERGAN) 25 MG tablet Take 25 mg by mouth every 6 (six) hours as needed.   Yes Historical Provider, MD  sertraline (ZOLOFT) 100 MG tablet Take 100 mg by mouth daily.   Yes Historical Provider, MD  traMADol (ULTRAM) 50 MG tablet Take 50 mg by mouth every 6 (six) hours as needed.   Yes Historical Provider, MD     Positive ROS: none  All other systems have been reviewed and were  otherwise negative with the exception of those mentioned in the HPI and as above.  Physical Exam: Filed Vitals:   03/19/12 1308  BP:   Pulse: 105  Temp:   Resp: 17    General: Alert, no acute distress Cardiovascular: No pedal edema Respiratory: No cyanosis, no use of accessory musculature GI: No organomegaly, abdomen is soft and non-tender Skin: No lesions in the area of chief complaint Neurologic: Sensation intact distally Psychiatric: Patient is competent for consent with normal mood and affect Lymphatic: No axillary or cervical lymphadenopathy  MUSCULOSKELETAL: L shoulder: +TTP over AC jt //+ttp over supraspinatus insertion// good ER strength  Assessment/Plan: AC JOINT ARTHRITIS Plan for Procedure(s): RESECTION DISTAL CLAVICAL and  ARTHROSCOPY SHOULDER to eval intraarticular structures  The risks benefits and alternatives were discussed with the patient including but not limited to the risks of nonoperative treatment, versus surgical intervention including infection, bleeding, nerve injury, malunion, nonunion, hardware prominence, hardware failure, need for hardware removal, blood clots, cardiopulmonary complications, morbidity, mortality, among others, and they were willing to proceed.  Predicted outcome is good, although there will be at least a six to nine month expected recovery.  Nimra Puccinelli L, MD 03/19/2012 1:13 PM

## 2012-03-22 NOTE — Op Note (Signed)
NAME:  Victoria Sloan, Victoria Sloan                     ACCOUNT NO.:  MEDICAL RECORD NO.:  [id number]  LOCATION:                                 FACILITY:  PHYSICIAN:  Harvie Junior, M.D.        DATE OF BIRTH:  DATE OF PROCEDURE:  03/19/2012 DATE OF DISCHARGE:                              OPERATIVE REPORT   PREOPERATIVE DIAGNOSES:  Impingement, acromioclavicular joint arthritis, and questionable rotator cuff for labral pathology.  POSTOPERATIVE DIAGNOSES: 1. Impingement, acromioclavicular joint arthritis, and questionable     rotator cuff for labral pathology. 2. Anterior superior cartilaginous defect.  PROCEDURE: 1. Arthroscopic subacromial decompression from lateral and posterior     compartment. 2. Arthroscopic distal clavicle resection through an anterior     compartment. 3. Debridement of anterior superior cartilaginous lesion from the     glenoid from within the glenohumeral joint.  SURGEON:  Harvie Junior, MD  ASSISTANT:  Marshia Ly, PA  ANESTHESIA:  General.  BRIEF HISTORY:  Victoria Sloan is a 46 year old female with long history of having left shoulder pain after an injury.  She was treated conservatively for a period of time.  MRI was obtained, which showed that she had significant edema in the distal clavicle.  Pain seemed to be well over the distal clavicle with movement and we had talked about doing an open distal clavicle.  On her last visit prior to final surgical decision-making, it seems as though she was having some pain, sort of more laterally over the supraspinatus insertion and after thoughtful evaluation and discussion, we felt that we probably should arthroscopically look in the glenohumeral joint just to make sure there is not something there if we are going to do an open distal clavicle. So at that point, we figured it probably doing an arthroscopic distal clavicle made sense if we are going to be do an arthroscopic surgery. She was brought to the operating  room for this procedure.  PROCEDURE:  The patient was brought to the operating room.  After adequate level of anesthesia was obtained with general anesthetic, the patient was placed supine on the operating table.  The left shoulder was then prepped and draped in usual sterile fashion.  Following this, routine arthroscopic examination of the shoulder revealed that there was a significant glenoid lesion on the anterior superior portion of the glenoid.  This was debrided with a suction shaver back to a smooth and stable rim.  Anterior labrum was well attached.  The biceps tendon was well attached.  Rotator cuff was pristine on the undersurface.  The glenohumeral joint had no other significant abnormality other than chondral lesion which was debrided.  Attention was then turned out of the glenohumeral joint into the subacromial space.  Once in the subacromial space, an anterolateral acromioplasty was performed.  There was a dramatic amount of bursal inflammation and fray and we spent some significant time debriding bursa laterally, anteriorly, posteriorly and once we did bursectomy, we did an anterolateral acromioplasty from the lateral posterior compartment.  Then we went over to the distal clavicle and then very deliberately went and took 20 mm out of the  distal clavicle.  We measured this.  We took every bony speck out.  We took the ArthroCare wand and placed it directly against the end of the clavicle and the side of the acromion to try to prevent clavicular regrowth, very careful to really get all of the bony spicules out of this area.  Once this was done, this area was copiously and thoroughly irrigated.  We took the shaver back and did a thorough debridement of the subacromial space at this point and then once this was done, irrigated and debrided one further time.  At this point, the portals were closed with a bandage.  A sterile compressive dressing was applied and the patient  was taken to recovery and was noted to be in satisfactory condition. Estimated blood loss for this procedure was none.     Harvie Junior, M.D.     Ranae Plumber  D:  03/19/2012  T:  03/20/2012  Job:  [id number]

## 2012-03-22 NOTE — Op Note (Deleted)
NAME:  Victoria Sloan, Victoria Sloan                     ACCOUNT NO.:  MEDICAL RECORD NO.:  [id number]  LOCATION:                                 FACILITY:  PHYSICIAN:  M[place]field Dann L. Carmin Dibartolo, M.D.        DATE OF BIRTH:  DATE OF PROCEDURE:  03/19/2012 DATE OF DISCHARGE:                              OPERATIVE REPORT   PREOPERATIVE DIAGNOSES:  Impingement, acromioclavicular joint arthritis, and questionable rotator cuff for labral pathology.  POSTOPERATIVE DIAGNOSES: 1. Impingement, acromioclavicular joint arthritis, and questionable     rotator cuff for labral pathology. 2. Anterior superior cartilaginous defect.  PROCEDURE: 1. Arthroscopic subacromial decompression from lateral and posterior     compartment. 2. Arthroscopic distal clavicle resection through an anterior     compartment. 3. Debridement of anterior superior cartilaginous lesion from the     glenoid from within the glenohumeral joint.  SURGEON:  Riggins Cisek L. Freddy Spadafora, MD  ASSISTANT:  James Bethune, PA  ANESTHESIA:  General.  BRIEF HISTORY:  Victoria Sloan is a 45-year-old female with long history of having left shoulder pain after an injury.  She was treated conservatively for a period of time.  MRI was obtained, which showed that she had significant edema in the distal clavicle.  Pain seemed to be well over the distal clavicle with movement and we had talked about doing an open distal clavicle.  On her last visit prior to final surgical decision-making, it seems as though she was having some pain, sort of more laterally over the supraspinatus insertion and after thoughtful evaluation and discussion, we felt that we probably should arthroscopically look in the glenohumeral joint just to make sure there is not something there if we are going to do an open distal clavicle. So at that point, we figured it probably doing an arthroscopic distal clavicle made sense if we are going to be do an arthroscopic surgery. She was brought to the operating  room for this procedure.  PROCEDURE:  The patient was brought to the operating room.  After adequate level of anesthesia was obtained with general anesthetic, the patient was placed supine on the operating table.  The left shoulder was then prepped and draped in usual sterile fashion.  Following this, routine arthroscopic examination of the shoulder revealed that there was a significant glenoid lesion on the anterior superior portion of the glenoid.  This was debrided with a suction shaver back to a smooth and stable rim.  Anterior labrum was well attached.  The biceps tendon was well attached.  Rotator cuff was pristine on the undersurface.  The glenohumeral joint had no other significant abnormality other than chondral lesion which was debrided.  Attention was then turned out of the glenohumeral joint into the subacromial space.  Once in the subacromial space, an anterolateral acromioplasty was performed.  There was a dramatic amount of bursal inflammation and fray and we spent some significant time debriding bursa laterally, anteriorly, posteriorly and once we did bursectomy, we did an anterolateral acromioplasty from the lateral posterior compartment.  Then we went over to the distal clavicle and then very deliberately went and took 20 mm out of the   distal clavicle.  We measured this.  We took every bony speck out.  We took the ArthroCare wand and placed it directly against the end of the clavicle and the side of the acromion to try to prevent clavicular regrowth, very careful to really get all of the bony spicules out of this area.  Once this was done, this area was copiously and thoroughly irrigated.  We took the shaver back and did a thorough debridement of the subacromial space at this point and then once this was done, irrigated and debrided one further time.  At this point, the portals were closed with a bandage.  A sterile compressive dressing was applied and the patient  was taken to recovery and was noted to be in satisfactory condition. Estimated blood loss for this procedure was none.     Morningstar Toft L. Yida Hyams, M.D.     JLG/MEDQ  D:  03/19/2012  T:  03/20/2012  Job:  [id number] 

## 2012-03-23 ENCOUNTER — Encounter (HOSPITAL_BASED_OUTPATIENT_CLINIC_OR_DEPARTMENT_OTHER): Payer: Self-pay | Admitting: Orthopedic Surgery

## 2012-04-20 ENCOUNTER — Other Ambulatory Visit: Payer: Self-pay | Admitting: Obstetrics and Gynecology

## 2012-04-20 DIAGNOSIS — Z9882 Breast implant status: Secondary | ICD-10-CM

## 2012-04-20 DIAGNOSIS — T8549XA Other mechanical complication of breast prosthesis and implant, initial encounter: Secondary | ICD-10-CM

## 2012-04-22 ENCOUNTER — Ambulatory Visit
Admission: RE | Admit: 2012-04-22 | Discharge: 2012-04-22 | Disposition: A | Discharge status: Home / Self Care | Payer: Medicaid Other | Source: Ambulatory Visit | Attending: Obstetrics and Gynecology | Admitting: Obstetrics and Gynecology

## 2012-04-22 DIAGNOSIS — Z9882 Breast implant status: Secondary | ICD-10-CM

## 2012-04-22 DIAGNOSIS — T8549XA Other mechanical complication of breast prosthesis and implant, initial encounter: Secondary | ICD-10-CM

## 2012-05-03 ENCOUNTER — Other Ambulatory Visit: Payer: Self-pay | Admitting: Family Medicine

## 2012-05-03 DIAGNOSIS — T8549XA Other mechanical complication of breast prosthesis and implant, initial encounter: Secondary | ICD-10-CM

## 2012-05-04 ENCOUNTER — Ambulatory Visit
Admission: RE | Admit: 2012-05-04 | Discharge: 2012-05-04 | Disposition: A | Discharge status: Home / Self Care | Payer: Medicaid Other | Source: Ambulatory Visit | Attending: Family Medicine | Admitting: Family Medicine

## 2012-05-04 DIAGNOSIS — T8549XA Other mechanical complication of breast prosthesis and implant, initial encounter: Secondary | ICD-10-CM

## 2012-08-14 ENCOUNTER — Other Ambulatory Visit: Payer: Self-pay

## 2013-05-05 ENCOUNTER — Other Ambulatory Visit: Payer: Self-pay

## 2013-05-18 ENCOUNTER — Other Ambulatory Visit: Payer: Self-pay | Admitting: Family Medicine

## 2013-05-18 DIAGNOSIS — R410 Disorientation, unspecified: Secondary | ICD-10-CM

## 2013-05-18 DIAGNOSIS — R519 Headache, unspecified: Secondary | ICD-10-CM

## 2013-05-23 ENCOUNTER — Ambulatory Visit
Admission: RE | Admit: 2013-05-23 | Discharge: 2013-05-23 | Disposition: A | Discharge status: Home / Self Care | Payer: Medicaid Other | Source: Ambulatory Visit | Attending: Family Medicine | Admitting: Family Medicine

## 2013-05-23 DIAGNOSIS — R410 Disorientation, unspecified: Secondary | ICD-10-CM

## 2014-07-03 ENCOUNTER — Telehealth: Payer: Self-pay | Admitting: Neurology

## 2014-07-03 NOTE — Telephone Encounter (Signed)
We moved patient appt due to a open slot so we will see her now for a new patient appt on 07-04-14.

## 2014-07-04 ENCOUNTER — Encounter: Payer: Self-pay | Admitting: Neurology

## 2014-07-04 ENCOUNTER — Ambulatory Visit (INDEPENDENT_AMBULATORY_CARE_PROVIDER_SITE_OTHER): Payer: Medicaid Other | Admitting: Neurology

## 2014-07-04 VITALS — BP 124/82 | HR 92 | Temp 98.5°F | Resp 22 | Ht 63.0 in | Wt 160.3 lb

## 2014-07-04 DIAGNOSIS — H539 Unspecified visual disturbance: Secondary | ICD-10-CM

## 2014-07-04 DIAGNOSIS — G43119 Migraine with aura, intractable, without status migrainosus: Secondary | ICD-10-CM

## 2014-07-04 MED ORDER — SUMATRIPTAN SUCCINATE 6 MG/0.5ML ~~LOC~~ SOLN: SUBCUTANEOUS | Status: DC

## 2014-07-04 NOTE — Progress Notes (Signed)
NEUROLOGY CONSULTATION NOTE  LAQUANA VILLARI MRN: [id number] DOB: Jun 08, 1966  Referring provider: Dr. Clovis Riley Primary care provider: Dr. Clovis Riley  Reason for consult:  Visual disturbance  HISTORY OF PRESENT ILLNESS: Victoria Sloan is a 49 year old right-handed woman with hypertension, migraine and anxiety who presents for visual disturbance and migraine.  Records, labs and MRI scan reviewed.  Onset:  49 years old Location:  Frontal on either side Quality:  stabbing Intensity:  10/10 Aura:  Several scotomas Prodrome:  Slurred speech Associated symptoms:  Nausea, vomiting, photophobia, osmophobia, unfocused vision Duration:  1 day (may linger for 3 days) Frequency:  1 to 2 times a week (8-10 headache days per month) Triggers/exacerbating factors:  Stress, certain scents Relieving factors:  none Activity:  Able to function with medication  Past abortive therapy:  Ibuprofen, BC, naproxen, Excedrin migraine (sick), Maxalt (ineffective), sumatriptan po Past preventative therapy:  Zoloft   Current abortive therapy:  Phenergan , Trmadol , Xanax Current preventative therapy:  amitriptyline , venlafaxine XR  (recently started for mood) Other medications:  alprazolam 1-2 times daily  For over a year, she has had problems with depth perception.  It typically occurs when she is driving, particularly in the front seat passenger side.  It appears that cars in front and behind her are closer than they appear.  She easily becomes anxious and fears that the car will hit the vehicle in front of them or that a car behind them will rear-end them.  She has significant anxiety regarding this.  There is no associated headache with these episodes.  It really only happens when she is in a car.  MRI of brain without contrast performed on 05/23/13 was unremarkable.  She was evaluated by Dr. 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  He said she was experiencing micropsia.  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   Caffeine:  Very little Alcohol:  occasionally Smoker:  no Diet:  Drinks water Exercise:  Walks daily Depression/stress:  She has history of anxiety.  Recently, stress level has increased.  Anxiety has been worse. Sleep hygiene:  Not rested Family history of headache:  mom  BMP from 06/26/14 was unremarkable.  PAST MEDICAL HISTORY: Past Medical History  Diagnosis Date  . Headache(784.0)   . Arthritis   . Hypertension   . Depression   . Anxiety   . Asthma   . GERD (gastroesophageal reflux disease)     PAST SURGICAL HISTORY: Past Surgical History  Procedure Laterality Date  . Colonoscopy    . Tonsillectomy    . Breast enhancement surgery    . Dilation and curettage of uterus    . Shoulder arthroscopy  03/19/2012    Procedure: ARTHROSCOPY SHOULDER;  Surgeon: Harvie Junior, MD;  Location: [facility];  Service: Orthopedics;  Laterality: Left;  left shoulder arthroscopy with distal clavicle resection    MEDICATIONS: Current Outpatient Prescriptions on File Prior to Visit  Medication Sig Dispense Refill  . ALPRAZolam (XANAX) 0.5 MG tablet Take 0.5 mg by mouth at bedtime as needed.    Marland Kitchen amitriptyline (ELAVIL) 50 MG tablet Take 50 mg by mouth at bedtime.    Marland Kitchen lisinopril (PRINIVIL,ZESTRIL) 10 MG tablet Take 10 mg by mouth daily.    . promethazine (PHENERGAN) 25 MG tablet Take 25 mg by mouth every 6 (six) hours as needed.    Marland Kitchen  etodolac (LODINE) 400 MG tablet Take 400 mg by mouth at bedtime.    Marland Kitchen oxyCODONE-acetaminophen (PERCOCET/ROXICET) 5-325 MG per tablet Take 1-2 tablets by mouth every 6 (six) hours as needed for pain. (Patient not taking: Reported on 07/04/2014) 40 tablet 0  . sertraline (ZOLOFT) 100 MG tablet Take 100 mg by mouth daily.     No current facility-administered medications on file prior to visit.    ALLERGIES: No Known  Allergies  FAMILY HISTORY: Family History  Problem Relation Age of Onset  . Depression Mother     SOCIAL HISTORY: History   Social History  . Marital Status: Divorced    Spouse Name: N/A    Number of Children: N/A  . Years of Education: N/A   Occupational History  . Not on file.   Social History Main Topics  . Smoking status: Never Smoker   . Smokeless tobacco: Never Used  . Alcohol Use: 0.0 oz/week    0 Not specified per week  . Drug Use: No  . Sexual Activity: No   Other Topics Concern  . Not on file   Social History Narrative    REVIEW OF SYSTEMS: Constitutional: No fevers, chills, or sweats, no generalized fatigue, change in appetite Eyes: No visual changes, double vision, eye pain Ear, nose and throat: No hearing loss, ear pain, nasal congestion, sore throat Cardiovascular: No chest pain, palpitations Respiratory:  No shortness of breath at rest or with exertion, wheezes GastrointestinaI: No nausea, vomiting, diarrhea, abdominal pain, fecal incontinence Genitourinary:  No dysuria, urinary retention or frequency Musculoskeletal:  No neck pain, back pain Integumentary: No rash, pruritus, skin lesions Neurological: as above Psychiatric: No depression, insomnia, anxiety Endocrine: No palpitations, fatigue, diaphoresis, mood swings, change in appetite, change in weight, increased thirst Hematologic/Lymphatic:  No anemia, purpura, petechiae. Allergic/Immunologic: no itchy/runny eyes, nasal congestion, recent allergic reactions, rashes  PHYSICAL EXAM: Filed Vitals:   07/04/14 1432  BP: 124/82  Pulse: 92  Temp: 98.5 F (36.9 C)  Resp: 22   General: No acute distress Head:  Normocephalic/atraumatic Eyes:  fundi unremarkable, without vessel changes, exudates, hemorrhages or papilledema. Neck: supple, no paraspinal tenderness, full range of motion Back: No paraspinal tenderness Heart: regular rate and rhythm Lungs: Clear to auscultation  bilaterally. Vascular: No carotid bruits. Neurological Exam: Mental status: alert and oriented to person, place, and time, recent and remote memory intact, fund of knowledge intact, attention and concentration intact, speech fluent and not dysarthric, language intact. Cranial nerves: CN I: not tested CN II: pupils equal, round and reactive to light, visual fields intact, fundi unremarkable, without vessel changes, exudates, hemorrhages or papilledema. CN III, IV, VI:  full range of motion, no nystagmus, no ptosis CN V: facial sensation intact CN VII: upper and lower face symmetric CN VIII: hearing intact CN IX, X: gag intact, uvula midline CN XI: sternocleidomastoid and trapezius muscles intact CN XII: tongue midline Bulk & Tone: normal, no fasciculations. Motor:  5/5 throughout Sensation:  Temperature and vibration intact Deep Tendon Reflexes:  2+ throughout, toes downgoing. Finger to nose testing:  No dysmetria Heel to shin:  No dysmetria Gait:  Normal station and stride.  Able to turn and walk in tandem. Romberg negative.  IMPRESSION: 1.  Migraine with aura 2.  Visual disturbance.  She describes possible altered depth perception.  Cars appear closer than they are.  Uncertain etiology.  Not associated with headache but may be related to migraine.  MRI of brain from last year was unremarkable.  May be related to anxiety.  PLAN: 1.  Advised that if headaches are not improved by time she follows up with Dr. Clovis RileyMitchell on 1/28, then to increase venlafaxine XR to 150mg  daily.  Continue amitriptyline 50mg . 2.  For abortive therapy, try Imitrex 6mg  [place] injection.  Phenergan for nausea. 3.  Ref[facility] to [facility] at [facility] for their opinion regarding altered visual perception. 4.  Follow up in 3 months.  Thank you for allowing me to take part in the care of this patient.  Shon MilletAdam Zakiah Gauthreaux, DO  CC: Lupe Carneyean Mitchell, MD

## 2014-07-04 NOTE — Patient Instructions (Signed)
1.  Continue venlafaxine 75mg  daily.  When you visit Dr. Clovis RileyMitchell on the 28th and headaches are not improved, recommend increasing to 150mg  daily 2.  At earliest onset of headache, take imitrex 6mg  injection.  Inject 6mg  in skin.  May repeat once in 2 hours if needed.  Do not exceed two injections in 24 hours 3.  Referral to neuro-ophthalmology 4.  Follow up in 3 months.

## 2014-07-12 ENCOUNTER — Telehealth: Payer: Self-pay | Admitting: *Deleted

## 2014-07-12 NOTE — Telephone Encounter (Signed)
Patient has an appt with [facility] on 09/29/14 at 12:45 she was also put on wait list if there is a canacellation Dr Daphine DeutscherMartin and resident will be seeing her in clinic  records were faxed

## 2014-08-09 ENCOUNTER — Ambulatory Visit: Payer: Medicaid Other | Admitting: Neurology

## 2014-08-22 ENCOUNTER — Emergency Department (EMERGENCY_DEPARTMENT_HOSPITAL)
Admission: EM | Admit: 2014-08-22 | Discharge: 2014-08-22 | Disposition: A | Discharge status: Other Acute Inpatient Hospital | Payer: Medicaid Other | Source: Home / Self Care | Attending: Emergency Medicine | Admitting: Emergency Medicine

## 2014-08-22 ENCOUNTER — Encounter (HOSPITAL_COMMUNITY): Payer: Self-pay | Admitting: *Deleted

## 2014-08-22 ENCOUNTER — Encounter (HOSPITAL_COMMUNITY): Payer: Self-pay

## 2014-08-22 ENCOUNTER — Inpatient Hospital Stay (HOSPITAL_COMMUNITY)
Admission: AD | Admit: 2014-08-22 | Discharge: 2014-08-25 | DRG: 885 | Disposition: A | Discharge status: Home / Self Care | Payer: Medicaid Other | Source: Intra-hospital | Attending: Psychiatry | Admitting: Psychiatry

## 2014-08-22 DIAGNOSIS — I10 Essential (primary) hypertension: Secondary | ICD-10-CM | POA: Diagnosis present

## 2014-08-22 DIAGNOSIS — F10920 Alcohol use, unspecified with intoxication, uncomplicated: Secondary | ICD-10-CM

## 2014-08-22 DIAGNOSIS — Z046 Encounter for general psychiatric examination, requested by authority: Secondary | ICD-10-CM

## 2014-08-22 DIAGNOSIS — F10929 Alcohol use, unspecified with intoxication, unspecified: Secondary | ICD-10-CM | POA: Insufficient documentation

## 2014-08-22 DIAGNOSIS — F32A Depression, unspecified: Secondary | ICD-10-CM | POA: Diagnosis present

## 2014-08-22 DIAGNOSIS — G43909 Migraine, unspecified, not intractable, without status migrainosus: Secondary | ICD-10-CM | POA: Diagnosis present

## 2014-08-22 DIAGNOSIS — F331 Major depressive disorder, recurrent, moderate: Secondary | ICD-10-CM | POA: Diagnosis present

## 2014-08-22 DIAGNOSIS — F19980 Other psychoactive substance use, unspecified with psychoactive substance-induced anxiety disorder: Secondary | ICD-10-CM

## 2014-08-22 DIAGNOSIS — F332 Major depressive disorder, recurrent severe without psychotic features: Secondary | ICD-10-CM | POA: Diagnosis present

## 2014-08-22 DIAGNOSIS — Z79899 Other long term (current) drug therapy: Secondary | ICD-10-CM | POA: Diagnosis not present

## 2014-08-22 DIAGNOSIS — F1020 Alcohol dependence, uncomplicated: Secondary | ICD-10-CM | POA: Diagnosis present

## 2014-08-22 DIAGNOSIS — F329 Major depressive disorder, single episode, unspecified: Secondary | ICD-10-CM | POA: Diagnosis present

## 2014-08-22 DIAGNOSIS — F419 Anxiety disorder, unspecified: Secondary | ICD-10-CM | POA: Diagnosis not present

## 2014-08-22 DIAGNOSIS — F19920 Other psychoactive substance use, unspecified with intoxication, uncomplicated: Secondary | ICD-10-CM | POA: Diagnosis present

## 2014-08-22 LAB — COMPREHENSIVE METABOLIC PANEL
ALT: 29 U/L (ref 0–35)
ANION GAP: 11 (ref 5–15)
AST: 25 U/L (ref 0–37)
Albumin: 5 g/dL (ref 3.5–5.2)
Alkaline Phosphatase: 99 U/L (ref 39–117)
BILIRUBIN TOTAL: 0.4 mg/dL (ref 0.3–1.2)
BUN: 9 mg/dL (ref 6–23)
CO2: 22 mmol/L (ref 19–32)
Calcium: 9.4 mg/dL (ref 8.4–10.5)
Chloride: 106 mmol/L (ref 96–112)
Creatinine, Ser: 0.7 mg/dL (ref 0.50–1.10)
GFR calc Af Amer: >90 mL/min (ref >90)
GFR calc non Af Amer: >90 mL/min (ref >90)
GLUCOSE: 127 mg/dL — AB (ref 70–99)
POTASSIUM: 3.6 mmol/L (ref 3.5–5.1)
Sodium: 139 mmol/L (ref 135–145)
Total Protein: 8.7 g/dL — ABNORMAL HIGH (ref 6.0–8.3)

## 2014-08-22 LAB — CBC WITH DIFFERENTIAL/PLATELET
BASOS ABS: 0 10*3/uL (ref 0.0–0.1)
BASOS PCT: 0 % (ref 0–1)
Eosinophils Absolute: 0.1 10*3/uL (ref 0.0–0.7)
Eosinophils Relative: 1 % (ref 0–5)
HEMATOCRIT: 43.5 % (ref 36.0–46.0)
Hemoglobin: 15.3 g/dL — ABNORMAL HIGH (ref 12.0–15.0)
Lymphocytes Relative: 23 % (ref 12–46)
Lymphs Abs: 2.6 10*3/uL (ref 0.7–4.0)
MCH: 32.3 pg (ref 26.0–34.0)
MCHC: 35.2 g/dL (ref 30.0–36.0)
MCV: 91.8 fL (ref 78.0–100.0)
MONO ABS: 0.6 10*3/uL (ref 0.1–1.0)
Monocytes Relative: 5 % (ref 3–12)
Neutro Abs: 8.2 10*3/uL — ABNORMAL HIGH (ref 1.7–7.7)
Neutrophils Relative %: 71 % (ref 43–77)
Platelets: 343 10*3/uL (ref 150–400)
RBC: 4.74 MIL/uL (ref 3.87–5.11)
RDW: 12.9 % (ref 11.5–15.5)
WBC: 11.6 10*3/uL — ABNORMAL HIGH (ref 4.0–10.5)

## 2014-08-22 LAB — ETHANOL: Alcohol, Ethyl (B): 168 mg/dL — ABNORMAL HIGH (ref 0–9)

## 2014-08-22 MED ORDER — LORAZEPAM 1 MG PO TABS
0.0000 mg | ORAL_TABLET | Freq: Four times a day (QID) | ORAL | Status: DC
  Administered 2014-08-22: 1 mg via ORAL
  Filled 2014-08-22: qty 1

## 2014-08-22 MED ORDER — ONDANSETRON 4 MG PO TBDP: 4.0000 mg | ORAL_TABLET | Freq: Four times a day (QID) | ORAL | Status: DC | PRN

## 2014-08-22 MED ORDER — VENLAFAXINE HCL ER 150 MG PO CP24
150.0000 mg | ORAL_CAPSULE | Freq: Every day | ORAL | Status: DC
  Administered 2014-08-23: 150 mg via ORAL
  Filled 2014-08-22 (×2): qty 1

## 2014-08-22 MED ORDER — LORAZEPAM 1 MG PO TABS: 1.0000 mg | ORAL_TABLET | Freq: Two times a day (BID) | ORAL | Status: DC

## 2014-08-22 MED ORDER — LOSARTAN POTASSIUM 50 MG PO TABS
50.0000 mg | ORAL_TABLET | Freq: Every day | ORAL | Status: DC
Start: 2014-08-23 — End: 2014-08-25
  Administered 2014-08-23 – 2014-08-25 (×3): 50 mg via ORAL
  Filled 2014-08-22 (×4): qty 1

## 2014-08-22 MED ORDER — ACYCLOVIR 400 MG PO TABS
400.0000 mg | ORAL_TABLET | Freq: Every day | ORAL | Status: DC
  Filled 2014-08-22: qty 1

## 2014-08-22 MED ORDER — LOPERAMIDE HCL 2 MG PO CAPS: 2.0000 mg | ORAL_CAPSULE | ORAL | Status: DC | PRN

## 2014-08-22 MED ORDER — THIAMINE HCL 100 MG/ML IJ SOLN: 100.0000 mg | Freq: Once | INTRAMUSCULAR | Status: DC

## 2014-08-22 MED ORDER — ACYCLOVIR 400 MG PO TABS
400.0000 mg | ORAL_TABLET | Freq: Every day | ORAL | Status: DC
  Administered 2014-08-22: 400 mg via ORAL
  Filled 2014-08-22: qty 1

## 2014-08-22 MED ORDER — LORAZEPAM 1 MG PO TABS: 1.0000 mg | ORAL_TABLET | Freq: Every day | ORAL | Status: DC

## 2014-08-22 MED ORDER — ACYCLOVIR 400 MG PO TABS: 400.0000 mg | ORAL_TABLET | Freq: Two times a day (BID) | ORAL | Status: DC

## 2014-08-22 MED ORDER — LOSARTAN POTASSIUM 50 MG PO TABS
50.0000 mg | ORAL_TABLET | Freq: Every day | ORAL | Status: DC
  Administered 2014-08-22: 50 mg via ORAL
  Filled 2014-08-22: qty 1

## 2014-08-22 MED ORDER — TRAZODONE HCL 50 MG PO TABS
50.0000 mg | ORAL_TABLET | Freq: Every evening | ORAL | Status: DC | PRN
  Filled 2014-08-22 (×4): qty 1

## 2014-08-22 MED ORDER — LORAZEPAM 1 MG PO TABS: 0.0000 mg | ORAL_TABLET | Freq: Two times a day (BID) | ORAL | Status: DC

## 2014-08-22 MED ORDER — VENLAFAXINE HCL ER 150 MG PO CP24: 150.0000 mg | ORAL_CAPSULE | Freq: Every day | ORAL | Status: DC

## 2014-08-22 MED ORDER — VENLAFAXINE HCL ER 75 MG PO CP24
75.0000 mg | ORAL_CAPSULE | Freq: Every day | ORAL | Status: DC
  Filled 2014-08-22 (×2): qty 1

## 2014-08-22 MED ORDER — ADULT MULTIVITAMIN W/MINERALS CH
1.0000 | ORAL_TABLET | Freq: Every day | ORAL | Status: DC
Start: 2014-08-23 — End: 2014-08-25
  Administered 2014-08-23 – 2014-08-25 (×3): 1 via ORAL
  Filled 2014-08-22 (×4): qty 1

## 2014-08-22 MED ORDER — THIAMINE HCL 100 MG/ML IJ SOLN: 100.0000 mg | Freq: Every day | INTRAMUSCULAR | Status: DC

## 2014-08-22 MED ORDER — SERTRALINE HCL 50 MG PO TABS
100.0000 mg | ORAL_TABLET | Freq: Every day | ORAL | Status: DC
  Administered 2014-08-22: 100 mg via ORAL
  Filled 2014-08-22: qty 2

## 2014-08-22 MED ORDER — AMITRIPTYLINE HCL 75 MG PO TABS
75.0000 mg | ORAL_TABLET | Freq: Every day | ORAL | Status: DC
Start: 2014-08-23 — End: 2014-08-22

## 2014-08-22 MED ORDER — VITAMIN B-1 100 MG PO TABS
100.0000 mg | ORAL_TABLET | Freq: Every day | ORAL | Status: DC
  Administered 2014-08-23 – 2014-08-25 (×3): 100 mg via ORAL
  Filled 2014-08-22 (×4): qty 1

## 2014-08-22 MED ORDER — LORAZEPAM 1 MG PO TABS
1.0000 mg | ORAL_TABLET | Freq: Four times a day (QID) | ORAL | Status: AC
  Administered 2014-08-22 – 2014-08-24 (×6): 1 mg via ORAL
  Filled 2014-08-22 (×6): qty 1

## 2014-08-22 MED ORDER — AMITRIPTYLINE HCL 25 MG PO TABS: 50.0000 mg | ORAL_TABLET | Freq: Every day | ORAL | Status: DC

## 2014-08-22 MED ORDER — AMITRIPTYLINE HCL 75 MG PO TABS
75.0000 mg | ORAL_TABLET | Freq: Every day | ORAL | Status: DC
  Administered 2014-08-22: 75 mg via ORAL
  Filled 2014-08-22: qty 1
  Filled 2014-08-22: qty 3

## 2014-08-22 MED ORDER — VENLAFAXINE HCL ER 150 MG PO CP24
150.0000 mg | ORAL_CAPSULE | Freq: Every day | ORAL | Status: DC
  Administered 2014-08-22: 150 mg via ORAL

## 2014-08-22 MED ORDER — ONDANSETRON 4 MG PO TBDP
4.0000 mg | ORAL_TABLET | Freq: Four times a day (QID) | ORAL | Status: DC | PRN
  Administered 2014-08-22: 4 mg via ORAL
  Filled 2014-08-22: qty 1

## 2014-08-22 MED ORDER — LORAZEPAM 1 MG PO TABS
1.0000 mg | ORAL_TABLET | Freq: Three times a day (TID) | ORAL | Status: AC
  Administered 2014-08-24 – 2014-08-25 (×3): 1 mg via ORAL
  Filled 2014-08-22 (×3): qty 1

## 2014-08-22 MED ORDER — AMITRIPTYLINE HCL 75 MG PO TABS
75.0000 mg | ORAL_TABLET | Freq: Every day | ORAL | Status: DC
  Filled 2014-08-22: qty 1

## 2014-08-22 MED ORDER — LORAZEPAM 1 MG PO TABS: 1.0000 mg | ORAL_TABLET | Freq: Four times a day (QID) | ORAL | Status: DC | PRN

## 2014-08-22 MED ORDER — VITAMIN B-1 100 MG PO TABS
100.0000 mg | ORAL_TABLET | Freq: Every day | ORAL | Status: DC
  Administered 2014-08-22 (×2): 100 mg via ORAL
  Filled 2014-08-22: qty 1

## 2014-08-22 MED ORDER — AMITRIPTYLINE HCL 25 MG PO TABS: 75.0000 mg | ORAL_TABLET | Freq: Every day | ORAL | Status: DC

## 2014-08-22 MED ORDER — CHLORDIAZEPOXIDE HCL 25 MG PO CAPS
25.0000 mg | ORAL_CAPSULE | Freq: Four times a day (QID) | ORAL | Status: DC | PRN
  Administered 2014-08-22: 25 mg via ORAL
  Filled 2014-08-22: qty 1

## 2014-08-22 MED ORDER — HYDROXYZINE HCL 25 MG PO TABS
25.0000 mg | ORAL_TABLET | Freq: Four times a day (QID) | ORAL | Status: DC | PRN
  Administered 2014-08-24 (×2): 25 mg via ORAL
  Filled 2014-08-22 (×2): qty 1

## 2014-08-22 NOTE — ED Notes (Signed)
Pt denies SI, HI or AV hallucinations, will not elaborate on why she is here this am.  Denies feeling hopeless.  Pt states I am here because I was trying to be a good mother.  AAO x 3, no distress noted,  Will monitor for safety.

## 2014-08-22 NOTE — Tx Team (Signed)
Initial Interdisciplinary Treatment Plan   PATIENT STRESSORS: Marital or family conflict   PATIENT STRENGTHS: Average or above average intelligence Capable of independent living General fund of knowledge   PROBLEM LIST: Problem List/Patient Goals Date to be addressed Date deferred Reason deferred Estimated date of resolution  depression 08/22/2014     " better myself" 08/22/2014     " to be happy" 08/22/2014                                          DISCHARGE CRITERIA:  Improved stabilization in mood, thinking, and/or behavior Need for constant or close observation no longer present  PRELIMINARY DISCHARGE PLAN: Outpatient therapy  PATIENT/FAMIILY INVOLVEMENT: This treatment plan has been presented to and reviewed with the patient, Victoria Sloan.  The patient and family have been given the opportunity to ask questions and make suggestions.  Leighton Parodyyson, Dorthy Hustead M 08/22/2014, 7:01 PM
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
## 2014-08-22 NOTE — ED Notes (Signed)
Pt has in belonging bag:  Silver colored necklace, blue jeans, black sandals, black shirt, tan bra, black phone
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
## 2014-08-22 NOTE — BH Assessment (Signed)
BHH Assessment Progress Note  Per Thedore MinsMojeed Akintayo, MD this pt requires psychiatric hospitalization. Pt presents under IVC, which Dr Jannifer FranklinAkintayo has upheld. Thurman CoyerEric Kaplan, RN, [facility] has assigned pt to Rm 300-2. Pt has deferred signing Consent to Release Information to a later date. Pt's nurse has been informed. She agrees to arrange for pt to be transported via [employer], and to call report to [phone].  Doylene Canninghomas Andilyn Bettcher, MA Triage Specialist 08/22/2014 @ 15:01
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
## 2014-08-22 NOTE — BHH Counselor (Signed)
Per Donell SievertSpencer Simon, PA, psychiatry to uphold or rescind IVC later this morning.  TTS Counselor shared disposition with attending physician, Dr Norlene Campbelltter, and she is in agreement. Dr Norlene Campbelltter did express some concern over pt's superficial scratches on her wrists, and this should be relayed to psychiatrists.    Cyndie MullAnna Damonte Frieson, [facility] Triage Specialist
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
## 2014-08-23 ENCOUNTER — Encounter (HOSPITAL_COMMUNITY): Payer: Self-pay | Admitting: Psychiatry

## 2014-08-23 DIAGNOSIS — F19980 Other psychoactive substance use, unspecified with psychoactive substance-induced anxiety disorder: Secondary | ICD-10-CM

## 2014-08-23 DIAGNOSIS — F19920 Other psychoactive substance use, unspecified with intoxication, uncomplicated: Secondary | ICD-10-CM | POA: Diagnosis present

## 2014-08-23 DIAGNOSIS — F332 Major depressive disorder, recurrent severe without psychotic features: Secondary | ICD-10-CM | POA: Diagnosis present

## 2014-08-23 DIAGNOSIS — F329 Major depressive disorder, single episode, unspecified: Secondary | ICD-10-CM

## 2014-08-23 DIAGNOSIS — F32A Depression, unspecified: Secondary | ICD-10-CM | POA: Diagnosis present

## 2014-08-23 LAB — RAPID URINE DRUG SCREEN, HOSP PERFORMED
AMPHETAMINES: NOT DETECTED
Barbiturates: NOT DETECTED
Benzodiazepines: POSITIVE — AB
Cocaine: NOT DETECTED
Opiates: NOT DETECTED
Tetrahydrocannabinol: NOT DETECTED

## 2014-08-23 MED ORDER — BISACODYL 10 MG RE SUPP: 10.0000 mg | Freq: Every day | RECTAL | Status: DC | PRN

## 2014-08-23 MED ORDER — MIRTAZAPINE 7.5 MG PO TABS
7.5000 mg | ORAL_TABLET | Freq: Every day | ORAL | Status: DC
  Administered 2014-08-23: 7.5 mg via ORAL
  Filled 2014-08-23 (×2): qty 1

## 2014-08-23 MED ORDER — SUMATRIPTAN SUCCINATE 6 MG/0.5ML ~~LOC~~ SOLN: 6.0000 mg | Freq: Once | SUBCUTANEOUS | Status: DC

## 2014-08-23 MED ORDER — ACYCLOVIR 200 MG PO CAPS
400.0000 mg | ORAL_CAPSULE | Freq: Every day | ORAL | Status: DC
  Administered 2014-08-23 – 2014-08-25 (×3): 400 mg via ORAL
  Filled 2014-08-23 (×4): qty 2

## 2014-08-23 MED ORDER — VENLAFAXINE HCL ER 75 MG PO CP24
225.0000 mg | ORAL_CAPSULE | Freq: Every day | ORAL | Status: DC
  Administered 2014-08-24 – 2014-08-25 (×2): 225 mg via ORAL
  Filled 2014-08-23 (×3): qty 3

## 2014-08-23 MED ORDER — SUMATRIPTAN SUCCINATE 6 MG/0.5ML ~~LOC~~ SOLN
6.0000 mg | Freq: Once | SUBCUTANEOUS | Status: DC
  Filled 2014-08-23: qty 0.5

## 2014-08-23 MED ORDER — LACTULOSE 10 GM/15ML PO SOLN
20.0000 g | Freq: Every day | ORAL | Status: DC | PRN
  Filled 2014-08-23: qty 30

## 2014-08-23 MED ORDER — SUMATRIPTAN SUCCINATE 6 MG/0.5ML ~~LOC~~ SOLN
6.0000 mg | Freq: Once | SUBCUTANEOUS | Status: AC
  Administered 2014-08-23: 6 mg via SUBCUTANEOUS
  Filled 2014-08-23: qty 0.5

## 2014-08-23 MED ORDER — LACTULOSE 10 GM/15ML PO SOLN: 30.0000 g | Freq: Every day | ORAL | Status: DC | PRN
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
## 2014-08-23 NOTE — Progress Notes (Signed)
D: Patient presents with anxious affect and mood. She reported feeling very tired from the medications she had taken the night before and asked if she can be excused from some of the groups today. Patient rates depression "1", feelings of hopelessness "0" and anxiety "1-2". Visible in the milieu and interactive with peers in the dayroom. Adheres to medication regimen.  A: Support and encouragement provided to patient. Administered medications per ordering MD. Monitor Q15 minute checks for safety.  R: Patient receptive. Denies SI/HI/AVH. Patient remains safe on the unit.

## 2014-08-23 NOTE — Progress Notes (Signed)
  Patient refused pregnancy test - did not want give urine sample - reports she does not want to be tested since she is not sexually active.  Victoria Sloan ,MD 08/23/14- 2:57pm

## 2014-08-23 NOTE — BHH Suicide Risk Assessment (Signed)
[facility]   Nursing information obtained from:    Demographic factors:    Current Mental Status:    Loss Factors:    Historical Factors:    Risk Reduction Factors:    Total Time spent with patient: 30 minutes Principal Problem: MDD (major depressive disorder), recurrent episode, severe Diagnosis:   Patient Active Problem List   Diagnosis Date Noted  . MDD (major depressive disorder), recurrent episode, severe [F33.2] 08/23/2014  . Alcohol use disorder, severe, dependence [F10.20] 08/22/2014     Continued Clinical Symptoms:  Alcohol Use Disorder Identification Test Final Score (AUDIT): 4 The "Alcohol Use Disorders Identification Test", Guidelines for Use in Primary Care, Second Edition.  World Science writerHealth Organization [facility](WHO). Score between 0-7:  no or low risk or alcohol related problems. Score between 8-15:  moderate risk of alcohol related problems. Score between 16-19:  high risk of alcohol related problems. Score 20 or above:  warrants further diagnostic evaluation for alcohol dependence and treatment.   CLINICAL FACTORS:   Severe Anxiety and/or Agitation Alcohol/Substance Abuse/Dependencies Previous Psychiatric Diagnoses and Treatments   Musculoskeletal: Strength & Muscle Tone: within normal limits Gait & Station: normal Patient leans: N/A  Psychiatric Specialty Exam: Physical Exam  ROS  Blood pressure 132/94, pulse 113, temperature 97.8 F (36.6 C), temperature source Oral, resp. rate 16, height 5' 0.75" (1.543 m), weight 68.493 kg (151 lb).Body mass index is 28.77 kg/(m^2).  Please see H&P.    SUICIDE RISK:   Mild:  Suicidal ideation of limited frequency, intensity, duration, and specificity.  There are no identifiable plans, no associated intent, mild dysphoria and related symptoms, good self-control (both objective and subjective assessment), few other risk factors, and identifiable protective factors, including available and accessible  social support.  PLAN OF CARE: Please see H&P.  Medical Decision Making:  Review of Psycho-Social Stressors (1), Established Problem, Worsening (2), Review of Last Therapy Session (1), Review of Medication Regimen & Side Effects (2) and Review of New Medication or Change in Dosage (2)  I certify that inpatient services furnished can reasonably be expected to improve the patient's condition.   Davetta Olliff MD 08/23/2014, 1:40 PM

## 2014-08-23 NOTE — Progress Notes (Signed)
Patient ID: Victoria Sloan, female   DOB: 1965/08/10, 49 y.o.   MRN: [id number]   D: Pt informed the writer that she was "changed from zoloft to effexor and it caused constipation". Otherwise, pt stated that she had a "pretty good" day. Stated she enjoyed speaking with her dr as well as her counselor. Stated they have worked together to formulate a plan for her aftercare. Stated that she realizes she may not get the support she needs at home, but will know where she needs to go for the support.  Pt was given scheduled 2000 meds and stated she was going to bed. However, Clinical research associatewriter encouraged pt to attend group.   A:  Support and encouragement was offered. 15 min checks continued for safety.  R: Pt remains safe.  .Marland Kitchen

## 2014-08-23 NOTE — Progress Notes (Signed)
Adult Psychoeducational Group Note  Date:  08/23/2014 Time:  11:09 PM  Participation Level:  Minimal  Additional Comments: Tonight's group was AA and pt was late to group.  She was present for part of group but left early.  Arrie AranChurch, Nneka Blanda J 08/23/2014, 11:09 PM

## 2014-08-23 NOTE — Progress Notes (Signed)
Patient refused pregnancy urine screening; voicing "I'm not sexually active and I have my cycle every 27-28 days and just had it recently.

## 2014-08-23 NOTE — Progress Notes (Signed)
Patient ID: [name], female   DOB: [birth year], 49 y.o.   MRN: [id number]     D: Pt was reported being nervous and was given 1mg  ativan. Pt had also received librium at 1833. Pt stated that when she lays down her "nervousness" increases. Writer encourage pt to attend group rather than lay down, since that's makes it worse. However, pt refused group.  A:  Support and encouragement was offered. 15 min checks continued for safety.  R: Pt remains safe.
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
## 2014-08-24 DIAGNOSIS — G43909 Migraine, unspecified, not intractable, without status migrainosus: Secondary | ICD-10-CM | POA: Insufficient documentation

## 2014-08-24 DIAGNOSIS — F10180 Alcohol abuse with alcohol-induced anxiety disorder: Secondary | ICD-10-CM

## 2014-08-24 DIAGNOSIS — F331 Major depressive disorder, recurrent, moderate: Secondary | ICD-10-CM | POA: Diagnosis present

## 2014-08-24 LAB — TSH: TSH: 7.432 u[IU]/mL — ABNORMAL HIGH (ref 0.350–4.500)

## 2014-08-24 MED ORDER — AMITRIPTYLINE HCL 25 MG PO TABS
75.0000 mg | ORAL_TABLET | Freq: Every day | ORAL | Status: DC
  Administered 2014-08-24: 75 mg via ORAL
  Filled 2014-08-24 (×2): qty 1

## 2014-08-24 NOTE — Clinical Social Work Note (Signed)
Patient states that she sees her physician [facility]ean [facility] and reports that she has an upcoming appointment and does not wish for CSW to make contact with her MD at this time. She declines for records to be sent.  Samuella BruinKristin Elyna Pangilinan, MSW, [employer] Clinical Social Worker [facility] [phone]

## 2014-08-24 NOTE — Progress Notes (Signed)
Recreation Therapy Notes  Animal-Assisted Activity/Therapy (AAA/T) Program Checklist/Progress Notes Patient Eligibility Criteria Checklist & Daily Group note for Rec Tx Intervention  Date:  02.25.2016 Time: 2:45pm [facility] [address] [facility]    AAA/T Program Assumption of Risk Form signed by Patient/ or Parent Legal Guardian yes  Patient is free of allergies or sever asthma yes  Patient reports no fear of animals yes  Patient reports no history of cruelty to animals yes  Patient understands his/her participation is voluntary yes  Patient washes hands before animal contact yes  Patient washes hands after animal contact yes  Behavioral Response: Appropriate   Education: Hand Washing, Appropriate Animal Interaction   Education Outcome: Acknowledges education.   Clinical Observations/Feedback: Patient appropriately engaged with therapy dog, handler and peers during session.   Marykay Lexenise L Tujuana Kilmartin, LRT/CTRS  Jearl KlinefelterBlanchfield, Joshuwa Vecchio L 08/24/2014 4:38 PM
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
## 2014-08-24 NOTE — [facility] Suicide Risk Assessment (Signed)
[facility] INPATIENT:  Family/Significant Other Suicide Prevention Education  Suicide Prevention Education:  Patient Refusal for Family/Significant Other Suicide Prevention Education: The patient Victoria Sloan has refused to provide written consent for family/significant other to be provided Family/Significant Other Suicide Prevention Education during admission and/or prior to discharge.  Physician notified. SPE reviewed with patient and brochure provided. Patient encouraged to return to hospital if having suicidal thoughts, patient verbalized his/her understanding and has no further questions at this time.   Victoria Sloan, Victoria Sloan 08/24/2014, 8:19 AM

## 2014-08-24 NOTE — [facility] Group Notes (Addendum)
LATE ENTRY FROM 2/24:  [facility] LCSW Group Therapy 08/23/14  1:15 PM Type of Therapy: Group Therapy Participation Level: Active  Participation Quality: Attentive, Sharing and Supportive  Affect: Appropriate  Cognitive: Alert and Oriented  Insight: Developing/Improving and Engaged  Engagement in Therapy: Developing/Improving and Engaged  Modes of Intervention: Clarification, Confrontation, Discussion, Education, Exploration, Limit-setting, Orientation, Problem-solving, Rapport Building, Dance movement psychotherapisteality Testing, Socialization and Support  Summary of Progress/Problems: The topic for group today was emotional regulation. This group focused on both positive and negative emotion identification and allowed group members to process ways to identify feelings, regulate negative emotions, and find healthy ways to manage internal/external emotions. Group members were asked to reflect on a time when their reaction to an emotion led to a negative outcome and explored how alternative responses using emotion regulation would have benefited them. Group members were also asked to discuss a time when emotion regulation was utilized when a negative emotion was experienced. Patient identified frustration and anger as difficult emotions to manage. She reports that she tends to drink and isolate herself to cope with these emotions. Patient states that she could instead take her dog out for walks at the end of the day in order to relieve stress. Group engaged in deep breathing exercise to teach relaxation skills and alleviate tension.  Samuella BruinKristin Corrinna Karapetyan, MSW, [employer]CS[employer] Clinical Social Worker [facility] [phone]
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
## 2014-08-24 NOTE — Progress Notes (Signed)
D: Patient's affect appropriate to situation and mood is anxious. She reported on the self inventory sheet that she's sleeping poorly at night, appetite and ability to concentrate are both good and energy level is normal. Patient rates depression/anxiety "1" and feelings of hopelessness "0". She's actively participating in groups throughout the day and conversing with peers in the dayroom. Patient continues to comply with the current medication regimen.  A: Support and encouragement provided to patient. Scheduled medications given per MD orders. Maintain Q15 minute checks for safety.   R: Patient receptive. Denies SI/HI and AVH. Patient remains safe on the hall.

## 2014-08-24 NOTE — Progress Notes (Signed)
Psychoeducational Group Note  Date:  08/24/2014 Time:  2154  Group Topic/Focus:  Wrap-Up Group:   The focus of this group is to help patients review their daily goal of treatment and discuss progress on daily workbooks.  Participation Level: Did Not Attend  Participation Quality:  Not Applicable  Affect:  Not Applicable  Cognitive:  Not Applicable  Insight:  Not Applicable  Engagement in Group: Not Applicable  Additional Comments:  The patient refused to attend group this evening since she was not feeling well.   Hazle CocaGOODMAN, Merlin Ege S 08/24/2014, 9:54 PM

## 2014-08-24 NOTE — Progress Notes (Signed)
Adult Psychoeducational Group Note  Date:  08/24/2014 Time:  9:00 AM  Group Topic/Focus:  Morning Wellness Group  Participation Level:  Active  Participation Quality:  Appropriate and Attentive  Affect:  Anxious  Cognitive:  Alert and Oriented  Insight: Appropriate  Engagement in Group:  Engaged  Modes of Intervention:  Discussion  Additional Comments: Patient voiced that she would like to get back into her gardening to help with relieving stress on a daily basis.  Melanee SpryByrd, Ronecia E 08/24/2014, 6:43 PM

## 2014-08-25 LAB — T4, FREE: Free T4: 1.02 ng/dL (ref 0.80–1.80)

## 2014-08-25 MED ORDER — LOSARTAN POTASSIUM 50 MG PO TABS: 50.0000 mg | ORAL_TABLET | Freq: Every day | ORAL | Status: DC

## 2014-08-25 MED ORDER — VENLAFAXINE HCL ER 75 MG PO CP24: 225.0000 mg | ORAL_CAPSULE | Freq: Every day | ORAL | Status: DC

## 2014-08-25 MED ORDER — ADULT MULTIVITAMIN W/MINERALS CH: 1.0000 | ORAL_TABLET | Freq: Every day | ORAL | Status: DC

## 2014-08-25 MED ORDER — ACYCLOVIR 400 MG PO TABS: 400.0000 mg | ORAL_TABLET | Freq: Every day | ORAL | Status: AC

## 2014-08-25 MED ORDER — AMITRIPTYLINE HCL 75 MG PO TABS: 75.0000 mg | ORAL_TABLET | Freq: Every day | ORAL | Status: DC

## 2014-08-25 NOTE — [facility] Group Notes (Signed)
   [facility]  08/25/2014  8:45 AM   Participation Quality: Alert, Appropriate and Oriented  Mood/Affect: Appropriate  Depression Rating: 0  Anxiety Rating: "It's good"  Thoughts of Suicide: Pt denies SI/HI  Will you contract for safety? Yes  Current AVH: Pt denies  Plan for Discharge/Comments: Pt attended discharge planning group and actively participated in group. CSW provided pt with today's workbook. Patient reports feeling good about returning home today. She rates low depression and anxiety. She plans to return home with her family to follow up with her PCP and [facility].  Transportation Means: Pt reports access to transportation  Supports: No supports mentioned at this time  Samuella BruinKristin Dosha Broshears, MSW, [employer] IncLCSWA Clinical Social Worker [facility] [phone]
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
## 2014-08-25 NOTE — Progress Notes (Signed)
  [facility] :  Will you be returning to the same living situation after discharge:  Yes,  patient plans to return home at discharge At discharge, do you have transportation home?: Yes,  patient reports access to transportation Do you have the ability to pay for your medications: Yes,  patient will be provided with prescriptions at discharge  Release of information consent forms completed and in the chart;  Patient's signature needed at discharge.  Patient to Follow up at: Follow-up Information    Follow up with [facility] On 08/30/2014.   Why:  Therapy appointment on Wednesday March 2nd at 2 pm with Rudi RummageSharon Burkett. Please call office if you need to reschedule appointment.   Contact information:   [facility]  [address]   [address]  [place], [place] [address]       Patient denies SI/HI: Yes,  denies    Aeronautical engineerafety Planning and Suicide Prevention discussed: Yes,  with patient  Have you used any form of tobacco in the last 30 days? (Cigarettes, Smokeless Tobacco, Cigars, and/or Pipes): No  Has patient been referred to the Quitline?: N/A patient is not a smoker  Alix Stowers L 08/25/2014, 9:47 AM

## 2014-08-25 NOTE — [facility] Suicide Risk Assessment (Signed)
[facility]   Demographic Factors:  Caucasian  Total Time spent with patient: 30 minutes  Musculoskeletal: Strength & Muscle Tone: within normal limits Gait & Station: normal Patient leans: N/A  Psychiatric Specialty Exam: Physical Exam  Review of Systems  Psychiatric/Behavioral: Positive for substance abuse (stable). Negative for depression and suicidal ideas.    Blood pressure 117/83, pulse 102, temperature 97.9 F (36.6 C), temperature source Oral, resp. rate 16, height 5' 0.75" (1.543 m), weight 68.493 kg (151 lb).Body mass index is 28.77 kg/(m^2).  General Appearance: Casual  Eye Contact::  Fair  Speech:  Clear and Coherent409  Volume:  Normal  Mood:  Euthymic  Affect:  Appropriate  Thought Process:  Coherent  Orientation:  Full (Time, Place, and Person)  Thought Content:  WDL  Suicidal Thoughts:  No  Homicidal Thoughts:  No  Memory:  Immediate;   Fair Recent;   Fair Remote;   Fair  Judgement:  Fair  Insight:  Fair  Psychomotor Activity:  Normal  Concentration:  Fair  Recall:  FiservFair  Fund of Knowledge:Fair  Language: Fair  Akathisia:  No  Handed:  Right  AIMS (if indicated):     Assets:  Physical Health Social Support  Sleep:  Number of Hours: 6.75  Cognition: WNL  ADL's:  Intact   Have you used any form of tobacco in the last 30 days? (Cigarettes, Smokeless Tobacco, Cigars, and/or Pipes): No  Has this patient used any form of tobacco in the last 30 days? (Cigarettes, Smokeless Tobacco, Cigars, and/or Pipes) No  Mental Status Per Nursing Assessment::   On Admission:     Current Mental Status by Physician: patient denies SI/HI/AH/VH  Loss Factors: NA  Historical Factors: Impulsivity  Risk Reduction Factors:   Positive social support and Positive therapeutic relationship  Continued Clinical Symptoms:  Alcohol/Substance Abuse/Dependencies Previous Psychiatric Diagnoses and Treatments  Cognitive Features That Contribute  To Risk:  None    Suicide Risk:  Minimal: No identifiable suicidal ideation.  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 MDD (major depressive disorder), recurrent episode, moderate [F33.1] 08/24/2014  . Migraine [G43.909] 08/24/2014  . 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 Alcohol use disorder, severe, dependence [F10.20] 08/22/2014    Follow-up Information    Follow up with [facility] On 08/30/2014.   Why:  Therapy appointment on Wednesday March 2nd at 2 pm with Rudi RummageSharon Burkett. Please call office if you need to reschedule appointment.   Contact information:   [facility]  [address]   [address]  [place], [place] [address]       Plan Of Care/Follow-up recommendations:  Activity:  NO RESTRICTION Diet:  REGULAR Tests:  Follow up on TSH in 6-8 weeks with PMD Other:  FOLLOW UP WITH AFTERCARE AS SCHEDULED  Is patient on multiple antipsychotic therapies at discharge:  No   Has Patient had three or more failed trials of antipsychotic monotherapy by history:  No  Recommended Plan for Multiple  Antipsychotic Therapies: NA    Rielly Brunn md 08/25/2014, 10:07 AM

## 2014-08-25 NOTE — Progress Notes (Addendum)
D: Pt. verbalizes readiness for discharge and denies SI/HI/AH/VH.  Pt. has reviewed safety/discharge plan with staff.  A:  Discharge instructions reviewed with pt./family and belongings returned.  Prescriptions given as applicable.  R: Pt. discharged without incident.  Joaquin MusicMary Arvis Zwahlen, RN

## 2014-08-25 NOTE — Tx Team (Signed)
Interdisciplinary Treatment Plan Update (Adult) Date: 08/25/2014   Time Reviewed: 9:30 AM  Progress in Treatment: Attending groups: Yes Participating in groups: Yes Taking medication as prescribed: Yes Tolerating medication: Yes Family/Significant other contact made: No, patient has declined for CSW to make collateral contact Patient understands diagnosis: Yes Discussing patient identified problems/goals with staff: Yes Medical problems stabilized or resolved: Yes Denies suicidal/homicidal ideation: Yes Issues/concerns per patient self-inventory: Yes Other:  New problem(s) identified: N/A  Discharge Plan or Barriers:   2/26: Patient plans to return home to [facility] and [facility] for therapy.   Reason for Continuation of Hospitalization:  Depression Anxiety Medication Stabilization   Comments: N/A  Estimated length of stay: Discharge anticipated for today 2/26.  For review of initial/current patient goals, please see plan of care.  Patient is a [age]year old female admitted following an argument with her family and being IVC'd by husband. [place]. Patient will benefit from crisis stabilization, medication evaluation, group therapy, and psycho education in addition to case management for discharge planning. Patient and CSW reviewed pt's identified goals and treatment plan. Pt verbalized understanding and agreed to treatment plan.    Attendees: Patient:    Family:    Physician: Dr. Jama Flavorsobos; Dr. Dub MikesLugo 08/25/2014 9:30 AM  Nursing: Manuela SchwartzJennifer Pritchett, Michele McalpineSarah Twyman, Janet,  RN 08/25/2014 9:30 AM  Clinical Social Worker: Samuella BruinKristin Nohemy Koop,  LCSWA 08/25/2014 9:30 AM  Other: Juline PatchQuylle Hodnett, LCSW 08/25/2014 9:30 AM  Other: Leisa LenzValerie Enoch, Vesta [facility]er[facility]arch Liaison 08/25/2014 9:30 AM  Other: Onnie BoerJennifer Clark, Case Manager 08/25/2014 9:30 AM  Other: Mosetta AnisAggie Nwoko, Elli Davis  NP 08/25/2014 9:30 AM  Other:  Tomasita Morrowelora Sutton, P4CC 08/25/2014 9:30 AM  Other:      Scribe for Treatment Team:  Samuella BruinKristin Maksym Pfiffner, MSW, [employer] IncLCSWA [phone]

## 2014-08-26 LAB — T3, FREE: T3 FREE: 2.7 pg/mL (ref 2.0–4.4)

## 2014-08-26 LAB — T4: T4, Total: 8.4 ug/dL (ref 4.5–12.0)

## 2014-08-30 NOTE — Progress Notes (Signed)
Patient Discharge Instructions:  After Visit Summary (AVS):   Faxed to:  08/30/14 Discharge Summary Note:   Faxed to:  08/30/14 Psychiatric Admission Assessment Note:   Faxed to:  08/30/14 Suicide Risk Assessment - Discharge Assessment:   Faxed to:  08/30/14 Faxed/Sent to the Next Level Care provider:  08/30/14 [facility]axed to [facility] @ [phone]  Jerelene ReddenSheena E Hatteras, 08/30/2014, 3:24 PM

## 2014-10-03 ENCOUNTER — Ambulatory Visit: Payer: Medicaid Other | Admitting: Neurology

## 2014-10-04 ENCOUNTER — Encounter: Payer: Self-pay | Admitting: Neurology

## 2014-11-03 ENCOUNTER — Other Ambulatory Visit: Payer: Self-pay | Admitting: Neurology

## 2015-08-16 ENCOUNTER — Other Ambulatory Visit: Payer: Self-pay | Admitting: Neurology

## 2015-08-16 NOTE — Telephone Encounter (Signed)
No medication until pt seen in follow up

## 2015-08-16 NOTE — Telephone Encounter (Signed)
Patient has not been seen since Jan. 2016.  She was supposed to have a 3 month follow up.  Medication was denied because of this.  Patient is going out of town and needs a refill.  Please advise.

## 2015-08-16 NOTE — Telephone Encounter (Signed)
Pt needs a refill on her headache shot [facility] at [facility] [employer] pt call back number is [phone] also patient is going to [facility] on the 08-23-15 she wanted Dr Everlena Cooper to know

## 2015-08-16 NOTE — Telephone Encounter (Signed)
[phone]  Pt needs a refill on her headache shot please call pt asap she is leaving to go out of town

## 2015-08-16 NOTE — Telephone Encounter (Signed)
Refill refused. Note to pharmacy. Patient hasn't been seen in over a year.

## 2015-08-16 NOTE — Telephone Encounter (Signed)
Spoke to patient and informed her that I could not call her Rx in per Dr. Arbutus Leas.

## 2018-12-15 ENCOUNTER — Emergency Department (HOSPITAL_COMMUNITY): Payer: PRIVATE HEALTH INSURANCE

## 2018-12-15 ENCOUNTER — Other Ambulatory Visit: Payer: Self-pay

## 2018-12-15 ENCOUNTER — Emergency Department (HOSPITAL_COMMUNITY)
Admission: EM | Admit: 2018-12-15 | Discharge: 2018-12-15 | Disposition: A | Discharge status: Home / Self Care | Payer: PRIVATE HEALTH INSURANCE | Attending: Emergency Medicine | Admitting: Emergency Medicine

## 2018-12-15 ENCOUNTER — Encounter (HOSPITAL_COMMUNITY): Payer: Self-pay | Admitting: Emergency Medicine

## 2018-12-15 DIAGNOSIS — I10 Essential (primary) hypertension: Secondary | ICD-10-CM | POA: Insufficient documentation

## 2018-12-15 DIAGNOSIS — R202 Paresthesia of skin: Secondary | ICD-10-CM | POA: Diagnosis not present

## 2018-12-15 DIAGNOSIS — R200 Anesthesia of skin: Secondary | ICD-10-CM | POA: Diagnosis not present

## 2018-12-15 DIAGNOSIS — M25512 Pain in left shoulder: Secondary | ICD-10-CM | POA: Diagnosis not present

## 2018-12-15 MED ORDER — PREDNISONE 20 MG PO TABS
60.0000 mg | ORAL_TABLET | Freq: Once | ORAL | Status: AC
  Administered 2018-12-15: 18:00:00 60 mg via ORAL
  Filled 2018-12-15: qty 3

## 2018-12-15 MED ORDER — METHOCARBAMOL 500 MG PO TABS
500.0000 mg | ORAL_TABLET | Freq: Once | ORAL | Status: AC
  Administered 2018-12-15: 500 mg via ORAL
  Filled 2018-12-15: qty 1

## 2018-12-15 MED ORDER — HYDROCODONE-ACETAMINOPHEN 5-325 MG PO TABS: 1.0000 | ORAL_TABLET | Freq: Four times a day (QID) | ORAL | 0 refills | Status: DC | PRN

## 2018-12-15 MED ORDER — HYDROCODONE-ACETAMINOPHEN 5-325 MG PO TABS
1.0000 | ORAL_TABLET | Freq: Once | ORAL | Status: AC
  Administered 2018-12-15: 1 via ORAL
  Filled 2018-12-15: qty 1

## 2018-12-15 MED ORDER — PREDNISONE 20 MG PO TABS: 60.0000 mg | ORAL_TABLET | Freq: Every day | ORAL | 0 refills | Status: AC

## 2018-12-15 MED ORDER — METHOCARBAMOL 500 MG PO TABS: 500.0000 mg | ORAL_TABLET | Freq: Two times a day (BID) | ORAL | 0 refills | Status: DC

## 2018-12-15 NOTE — ED Triage Notes (Signed)
Patient reports feeling a severe "popping" pain in her L shoulder earlier today while at work - states she was sitting and was lifting her arm when she felt it - reports pain went down her arm into her hand and she also feels a constant tingling sensation. She has had part of her L clavicle removed d/t previous injury/fracture but states she has never felt this kind of pain before. Unable to lift arm all the way d/t pain. Radial pulses strong bilaterally, but L hand is slightly cooler than the R hand.

## 2018-12-15 NOTE — ED Provider Notes (Signed)
[facility] Provider Note   CSN: [id number] Arrival date & time: 12/15/18  1529    History   Chief Complaint Chief Complaint  Patient presents with  . Shoulder Pain  . Numbness    HPI Victoria Sloan is a 53 y.o. female.     Victoria Sloan is a 53 y.o. female with a history of hypertension and prior clavicle surgery, who presents to the emergency department for evaluation of left shoulder pain.  Patient reports this morning while at work she reached her left arm across her body to get her phone and felt a pop in her arm and then had pain shooting down her arm with tingling sensation down to her fingers.  She reports that her fingers almost looked purple for a few seconds but then seemed to return to normal color.  She had a second less severe pop a few minutes later.  Since then she has had pain and stiffness in the shoulder with a burning pain shooting down the arm with intermittent tingling she is able to move the arm at the wrist and elbow without pain but has pain with any shoulder movement, especially when lifting upwards.  She reports she has had some similar less severe shoulder issues recently with lifting and shoulder movements, but has been ignoring it as it has not been severe, today she called her PCP who recommended she come to the ED for evaluation.  Her shoulder surgery was done previously by Dr. Luiz BlareGraves.  Pain is well localized to the shoulder she has no associated chest pain, shortness of breath or cough.     Past Medical History:  Diagnosis Date  . Hypertension     There are no active problems to display for this patient.   Past Surgical History:  Procedure Laterality Date  . CLAVICLE SURGERY     part of clavicle removed d/t injury     OB History   No obstetric history on file.      Home Medications    Prior to Admission medications   Not on File    Family History No family history on file.  Social History  Social History   Tobacco Use  . Smoking status: Never Smoker  . Smokeless tobacco: Never Used  Substance Use Topics  . Alcohol use: Yes    Comment: social  . Drug use: Never     Allergies   Amoxicillin   Review of Systems Review of Systems  Constitutional: Negative for chills and fever.  Respiratory: Negative for shortness of breath.   Cardiovascular: Negative for chest pain.  Musculoskeletal: Positive for arthralgias. Negative for joint swelling.  Skin: Negative for color change and rash.  Neurological: Positive for numbness (Tingling). Negative for weakness.  All other systems reviewed and are negative.    Physical Exam Updated Vital Signs BP (!) 150/89 (BP Location: Right Arm)   Pulse (!) 103   Temp 98.6 F (37 C) (Oral)   Resp 16   SpO2 100%   Physical Exam Vitals signs and nursing note reviewed.  Constitutional:      General: She is not in acute distress.    Appearance: Normal appearance. She is well-developed and normal weight. She is not diaphoretic.  HENT:     Head: Normocephalic and atraumatic.     Mouth/Throat:     Mouth: Mucous membranes are moist.     Pharynx: Oropharynx is clear.  Eyes:     General:  Right eye: No discharge.        Left eye: No discharge.     Pupils: Pupils are equal, round, and reactive to light.  Neck:     Musculoskeletal: Neck supple.     Comments: No midline C-spine tenderness, no tenderness over the left neck musculature. Cardiovascular:     Rate and Rhythm: Normal rate and regular rhythm.     Heart sounds: Normal heart sounds. No murmur. No friction rub. No gallop.   Pulmonary:     Effort: Pulmonary effort is normal. No respiratory distress.     Breath sounds: Normal breath sounds. No wheezing or rales.     Comments: Respirations equal and unlabored, patient able to speak in full sentences, lungs clear to auscultation bilaterally Abdominal:     General: Bowel sounds are normal. There is no distension.      Palpations: Abdomen is soft. There is no mass.     Tenderness: There is no abdominal tenderness. There is no guarding.  Musculoskeletal:        General: No deformity.     Comments: Left upper extremity with 2+ radial pulse, warm and well-perfused, cardinal hand movements intact, 5/5 grip strength, normal range of motion at the wrist and elbow.  Range of motion of the shoulder is limited by pain, passive range of motion intact.  There is no obvious bony deformity.  No swelling.  There is tenderness over the anterior and posterior shoulder compartments, no overlying skin changes. Left upper extremity warm with normal coloration when compared to right.  Skin:    General: Skin is warm and dry.     Capillary Refill: Capillary refill takes less than 2 seconds.  Neurological:     Mental Status: She is alert.     Coordination: Coordination normal.     Comments: Speech is clear, able to follow commands Moves extremities without ataxia, coordination intact  Psychiatric:        Mood and Affect: Mood normal.        Behavior: Behavior normal.      ED Treatments / Results  Labs (all labs ordered are listed, but only abnormal results are displayed) Labs Reviewed - No data to display  EKG None  Radiology Dg Shoulder Left  Result Date: 12/15/2018 CLINICAL DATA:  Pain with lifting EXAM: LEFT SHOULDER - 2+ VIEW COMPARISON:  None. FINDINGS: No fracture or malalignment at the glenohumeral interval. Suspected prior resection of distal left clavicle with widened appearance of the [facility] joint. IMPRESSION: 1. Presumed postsurgical and/or prior posttraumatic change of the distal left clavicle and AC joint. 2. No definite acute osseous abnormality. Electronically Signed   By: Jasmine PangKim  Fujinaga M.D.   On: 12/15/2018 17:13    Procedures Procedures (including critical care time)  Medications Ordered in ED Medications  predniSONE (DELTASONE) tablet 60 mg (60 mg Oral Given 12/15/18 1811)  HYDROcodone-acetaminophen  (NORCO/VICODIN) 5-325 MG per tablet 1 tablet (1 tablet Oral Given 12/15/18 1811)  methocarbamol (ROBAXIN) tablet 500 mg (500 mg Oral Given 12/15/18 1811)     Initial Impression / Assessment and Plan / ED Course  I have reviewed the triage vital signs and the nursing notes.  Pertinent labs & imaging results that were available during my care of the patient were reviewed by me and considered in my medical decision making (see chart for details).  Patient with prior shoulder injury presents with pain tingling and decreased range of motion of the left shoulder after she heard a popping  today while reaching across her body.  The left upper extremity is now neurovascularly intact, the hand is warm and well-perfused and there is no obvious deformity.  X-ray shows previous surgical changes to the clavicle but no acute malalignment or fracture.  Suspect patient may have subluxed shoulder causing vasospasm and nerve inflammation.  Her symptoms have improved some with treatment with steroids, pain medication and muscle relaxer here in the emergency department.  Will place patient in shoulder immobilizer and have her follow-up with orthopedics.  Return precautions discussed.  Patient expresses understanding and agreement with plan, discharged home in good condition.  Final Clinical Impressions(s) / ED Diagnoses   Final diagnoses:  Acute pain of left shoulder  Paresthesias    ED Discharge Orders         Ordered    predniSONE (DELTASONE) 20 MG tablet  Daily     12/15/18 1945    HYDROcodone-acetaminophen (NORCO) 5-325 MG tablet  Every 6 hours PRN     12/15/18 1945    methocarbamol (ROBAXIN) 500 MG tablet  2 times daily     12/15/18 1945           Janet Berlin 12/15/18 2038    Lacretia Leigh, MD 12/16/18 2256

## 2018-12-15 NOTE — Discharge Instructions (Signed)
Please use shoulder sling, alternate ice and heat, take steroids as directed for the next 5 days, muscle relaxers twice daily as needed, pain medication and ibuprofen.  Please follow-up with your orthopedist.  Return if you have persistent discoloration of your hand, worsening numbness or weakness or complete inability to move the arm or significantly worsened pain.

## 2018-12-16 ENCOUNTER — Encounter (HOSPITAL_COMMUNITY): Payer: Self-pay | Admitting: Psychiatry

## 2018-12-29 ENCOUNTER — Other Ambulatory Visit: Payer: Self-pay | Admitting: Orthopedic Surgery

## 2018-12-29 DIAGNOSIS — M25512 Pain in left shoulder: Secondary | ICD-10-CM

## 2019-01-17 ENCOUNTER — Other Ambulatory Visit: Payer: Self-pay | Admitting: Orthopedic Surgery

## 2019-01-22 ENCOUNTER — Other Ambulatory Visit: Payer: PRIVATE HEALTH INSURANCE

## 2020-08-20 ENCOUNTER — Encounter: Payer: Self-pay | Admitting: General Practice

## 2021-08-08 ENCOUNTER — Other Ambulatory Visit: Payer: Self-pay | Admitting: Internal Medicine

## 2021-08-08 DIAGNOSIS — R1319 Other dysphagia: Secondary | ICD-10-CM

## 2021-12-11 ENCOUNTER — Other Ambulatory Visit: Payer: Self-pay | Admitting: Family Medicine

## 2021-12-11 DIAGNOSIS — R410 Disorientation, unspecified: Secondary | ICD-10-CM

## 2021-12-20 ENCOUNTER — Ambulatory Visit
Admission: RE | Admit: 2021-12-20 | Discharge: 2021-12-20 | Disposition: A | Discharge status: Home / Self Care | Payer: No Typology Code available for payment source | Source: Ambulatory Visit | Attending: Family Medicine | Admitting: Family Medicine

## 2021-12-20 DIAGNOSIS — R410 Disorientation, unspecified: Secondary | ICD-10-CM

## 2021-12-20 MED ORDER — GADOBENATE DIMEGLUMINE 529 MG/ML IV SOLN
15.0000 mL | Freq: Once | INTRAVENOUS | Status: AC | PRN
  Administered 2021-12-20: 15 mL via INTRAVENOUS

## 2022-01-14 ENCOUNTER — Encounter: Payer: Self-pay | Admitting: Neurology

## 2022-06-16 NOTE — Progress Notes (Unsigned)
NEUROLOGY CONSULTATION NOTE  [name] MRN: [id number] DOB: [birth year]  Referring provider: Elsworth Soho, MD Primary care provider: Elsworth Soho, MD  Reason for consult:  headache, numbness  Assessment/Plan:   Chronic migraine with aura, without status migrainosus, not intractable  Migraine prevention:  Start propranolol 30mg  BID.  We can increase to 40mg  BID in 4 weeks if needed. Migraine rescue:  She will try samples of Ubrelvy 100mg  and give me an update.  Promethazine for nausea Limit use of pain relievers to no more than 2 days out of week to prevent risk of rebound or medication-overuse headache. Keep headache diary Follow up 4 to 5 months.    Subjective:  Victoria Sloan is a 56 year old right-handed female with hypertension and anxiety who presents for headaches and numbness.  History supplemented by referring provider's note.  History of migraines since 56 years old.  Traditionally severe right or left sided stabbing frontal headaches.  Preceded by slurred speech/word-finding difficulties that continue with the headache as well as preceded by visual aura of scotomas.  Associated with nausea, vomiting, photophobia, phonophobia, osmophobia and blurred vision.  Typically lasts 1 day but has lasted up to 3 days.  Triggers include stress and certain smells.  Aggravated by movement.    Progressively have gotten worse since around 2021.  They seem more severe.  They now tend to be only left sided and now associated with left sided facial numbness.  They occur most days a week (over 15 headache days a month).    Treats with sumatriptan [place] (makes her drowsy and sleep) or less frequently, tramadol.  MRI of brain with and without contrast on 12/20/2021 personally reviewed was normal   Past NSAIDS/analgesics:  Excedrin, ibuprofen, naproxen, BC Past abortive triptans:  rizatriptan, sumatriptan tab Past abortive ergotamine:  none Past muscle relaxants:  none Past  anti-emetic:  none Past antihypertensive medications:  none Past antidepressant medications:  venlafaxine, sertraline Past anticonvulsant medications:  none Past anti-CGRP:  none Past vitamins/Herbal/Supplements:  none Past antihistamines/decongestants:  none Other past therapies:  none  Current NSAIDS/analgesics:  tramadol 50mg  (rarely) Current triptans:  sumatriptan 6mg  [place] Current ergotamine:  none Current anti-emetic:  promethazine 25mg  Current muscle relaxants:  none Current Antihypertensive medications:  none Current Antidepressant medications:  amitriptyline 75mg  daily, duloxetine 60mg  daily Current Anticonvulsant medications:  none Current anti-CGRP:  none Current Vitamins/Herbal/Supplements:  none Current Antihistamines/Decongestants:  none Other therapy:  none Birth control:  none Other medications:  alprazolam   Caffeine:   1 cup of coffee daily, little soda Diet:  "not enough" water.  Skips meals (lunch, sometimes dinner) Exercise:  no Depression:  no; Anxiety:  yes Sleep hygiene:  trouble falling and staying asleep Family history of headache:  mom (migraines)   In 2014, she has had problems with depth perception.  It typically occurs when she is driving, particularly in the front seat passenger side.  It appears that cars in front and behind her are closer than they appear.  She easily becomes anxious and fears that the car will hit the vehicle in front of them or that a car behind them will rear-end them.  She has significant anxiety regarding this.  There is no associated headache with these episodes.  It really only happens when she is in a car.  MRI of brain without contrast performed on 05/23/13 was unremarkable.  She was evaluated by Dr. 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  He said she was experiencing micropsia.  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     PAST MEDICAL HISTORY: Past Medical History:  Diagnosis Date   Anxiety    Arthritis    Asthma    Depression    GERD (gastroesophageal reflux disease)    Headache(784.0)    Hypertension     PAST SURGICAL HISTORY: Past Surgical History:  Procedure Laterality Date   BREAST ENHANCEMENT SURGERY     CLAVICLE SURGERY     part of clavicle removed d/t injury   COLONOSCOPY     DILATION AND CURETTAGE OF UTERUS     SHOULDER ARTHROSCOPY  03/19/2012   Procedure: ARTHROSCOPY SHOULDER;  Surgeon: Harvie Junior, MD;  Location: [facility];  Service: Orthopedics;  Laterality: Left;  left shoulder arthroscopy with distal clavicle resection   TONSILLECTOMY      MEDICATIONS: Current Outpatient Medications on File Prior to Visit  Medication Sig Dispense Refill   acyclovir (ZOVIRAX) 400 MG tablet Take 1 tablet (400 mg total) by mouth daily.  6   ALPRAZolam (XANAX) 0.5 MG tablet Take 0.5 mg by mouth 3 (three) times daily as needed for anxiety.      amitriptyline (ELAVIL) 75 MG tablet Take 75 mg by mouth every evening.     DULoxetine (CYMBALTA) 60 MG capsule Take 60 mg by mouth daily.     etodolac (LODINE) 400 MG tablet Take 400 mg by mouth daily as needed (for pain/inflammation).      promethazine (PHENERGAN) 25 MG tablet Take 25 mg by mouth daily as needed (for migraine-related nausea).      SUMAtriptan 6 MG/0.5ML SOAJ INJECT 0.5 ML INTO SKIN AT EARLIEST ONSET OF HEADACHE. MAY REPET ONCE IN 2 HOURS IF HEADACHE PERSIST 2 Syringe 3   traMADol (ULTRAM) 50 MG tablet Take 50 mg by mouth every 6 (six) hours as needed (for migraine-related pain).      No current facility-administered medications on file prior to visit.     ALLERGIES: Allergies  Allergen Reactions   Amoxicillin Itching    Vaginal itching/yeast infection   Amoxicillin Other (See Comments)    Results in yeast infections if taken    Lisinopril     Facial swelling    FAMILY HISTORY: Family History  Problem  Relation Age of Onset   Depression Mother     Objective:  Blood pressure 129/82, pulse (!) 123, height 5\' 2"  (1.575 m), weight 167 lb 3.2 oz (75.8 kg), SpO2 98 %. General: No acute distress.  Patient appears well-groomed.   Head:  Normocephalic/atraumatic Eyes:  fundi examined but not visualized Neck: supple, no paraspinal tenderness, full range of motion Back: No paraspinal tenderness Heart: regular rate and rhythm Lungs: Clear to auscultation bilaterally. Vascular: No carotid bruits. Neurological Exam: Mental status: alert and oriented to person, place, and time, speech fluent and not dysarthric, language intact. Cranial nerves: CN I: not tested CN II: pupils equal, round and reactive to light, visual fields intact CN III, IV, VI:  full range of motion, no nystagmus, no ptosis CN V: left V2-V3 feels "tingling" to touch CN VII: upper and lower face symmetric CN VIII: hearing intact CN IX, X: gag intact, uvula midline CN XI: sternocleidomastoid and trapezius muscles intact CN XII: tongue midline Bulk & Tone: normal, no fasciculations. Motor:  muscle strength 5/5 throughout Sensation:  Pinprick, temperature and vibratory sensation intact. Deep Tendon Reflexes:  2+ throughout,  toes downgoing.   Finger to nose testing:  Without dysmetria.   Heel to shin:  Without dysmetria.   Gait:  Normal station and stride.  Romberg negative.    Thank you for allowing me to take part in the care of this patient.  Shon Millet, DO  CC: Elsworth Soho, MD

## 2022-06-18 ENCOUNTER — Encounter: Payer: Self-pay | Admitting: Neurology

## 2022-06-18 ENCOUNTER — Ambulatory Visit (INDEPENDENT_AMBULATORY_CARE_PROVIDER_SITE_OTHER): Payer: Self-pay | Admitting: Neurology

## 2022-06-18 VITALS — BP 129/82 | HR 123 | Ht 62.0 in | Wt 167.2 lb

## 2022-06-18 DIAGNOSIS — G43E09 Chronic migraine with aura, not intractable, without status migrainosus: Secondary | ICD-10-CM

## 2022-06-18 MED ORDER — PROPRANOLOL HCL 20 MG PO TABS: 30.0000 mg | ORAL_TABLET | Freq: Two times a day (BID) | ORAL | 5 refills | Status: AC

## 2022-06-18 NOTE — Patient Instructions (Signed)
  Start propranolol 1 1/2 tablets twice daily.  Contact us in 4 weeks with update and we can increase dose if needed. Take Ubrelvy 100mg  at earliest onset of headache.  May repeat dose once in 2 hours if needed.  Maximum 2 tablets in 24 hours. Limit use of pain relievers to no more than 2 days out of the week.  These medications include acetaminophen, NSAIDs (ibuprofen/Advil/Motrin, naproxen/Aleve, triptans (Imitrex/sumatriptan), Excedrin, and narcotics.  This will help reduce risk of rebound headaches. Be aware of common food triggers:  - Caffeine:  coffee, black tea, cola, Mt. Dew  - Chocolate  - Dairy:  aged cheeses (brie, blue, cheddar, gouda, [place], provolone, [place], Swiss, etc), chocolate milk, buttermilk, sour cream, limit eggs and yogurt  - Nuts, peanut butter  - Alcohol  - Cereals/grains:  FRESH breads (fresh bagels, sourdough, doughnuts), yeast productions  - Processed/canned/aged/cured meats (pre-packaged deli meats, hotdogs)  - MSG/glutamate:  soy sauce, flavor enhancer, pickled/preserved/marinated foods  - Sweeteners:  aspartame (Equal, Nutrasweet).  Sugar and Splenda are okay  - Vegetables:  legumes (lima beans, lentils, snow peas, fava beans, pinto peans, peas, garbanzo beans), sauerkraut, onions, olives, pickles  - Fruit:  avocados, bananas, citrus fruit (orange, lemon, grapefruit), mango  - Other:  Frozen meals, macaroni and cheese Routine exercise Stay adequately hydrated (aim for 64 oz water daily) Keep headache diary Maintain proper stress management Maintain proper sleep hygiene Do not skip meals Consider supplements:  magnesium citrate 400mg  daily, riboflavin 400mg  daily, coenzyme Q10 100mg  three times daily. 12.  Follow up in 4 to 5 months.

## 2022-11-19 NOTE — Progress Notes (Deleted)
NEUROLOGY FOLLOW UP OFFICE NOTE  [address] [id number]  Assessment/Plan:   Chronic migraine with aura, without status migrainosus, not intractable   Migraine prevention:  ***. Migraine rescue:  ***.  Promethazine for nausea Limit use of pain relievers to no more than 2 days out of week to prevent risk of rebound or medication-overuse headache. Keep headache diary Follow up ***       Subjective:  Victoria Sloan is a 57 year old right-handed female with hypertension and anxiety who follows up for migraines.  UPDATE: Propranolol Bernita Raisin  Intensity:  *** Duration:  *** Frequency:  *** Frequency of abortive medication: *** Current NSAIDS/analgesics:  tramadol 50mg  (rarely) Current triptans:  sumatriptan 6mg  [place] Current ergotamine:  none Current anti-emetic:  promethazine 25mg  Current muscle relaxants:  none Current Antihypertensive medications:  propranolol 30mg  twice daily. Current Antidepressant medications:  amitriptyline 75mg  daily, duloxetine 60mg  daily Current Anticonvulsant medications:  none Current anti-CGRP:  none Current Vitamins/Herbal/Supplements:  none Current Antihistamines/Decongestants:  none Other therapy:  none Birth control:  none Other medications:  alprazolam     Caffeine:   1 cup of coffee daily, little soda Diet:  "not enough" water.  Skips meals (lunch, sometimes dinner) Exercise:  no Depression:  no; Anxiety:  yes Sleep hygiene:  trouble falling and staying asleep  HISTORY:  History of migraines since 57 years old.  Traditionally severe right or left sided stabbing frontal headaches.  Preceded by slurred speech/word-finding difficulties that continue with the headache as well as preceded by visual aura of scotomas.  Associated with nausea, vomiting, photophobia, phonophobia, osmophobia and blurred vision.  Typically lasts 1 day but has lasted up to 3 days.  Triggers include stress and certain smells.  Aggravated by movement.      Progressively have gotten worse since around 2021.  They seem more severe.  They now tend to be only left sided and now associated with left sided facial numbness.  They occur most days a week (over 15 headache days a month).     Treats with sumatriptan [place] (makes her drowsy and sleep) or less frequently, tramadol.   MRI of brain with and without contrast on 12/20/2021 personally reviewed was normal     Past NSAIDS/analgesics:  Excedrin, ibuprofen, naproxen, BC Past abortive triptans:  rizatriptan, sumatriptan tab Past abortive ergotamine:  none Past muscle relaxants:  none Past anti-emetic:  none Past antihypertensive medications:  none Past antidepressant medications:  venlafaxine, sertraline Past anticonvulsant medications:  none Past anti-CGRP:  none Past vitamins/Herbal/Supplements:  none Past antihistamines/decongestants:  none Other past therapies:  none    Family history of headache:  mom (migraines)    In 2014, she has had problems with depth perception.  It typically occurs when she is driving, particularly in the front seat passenger side.  It appears that cars in front and behind her are closer than they appear.  She easily becomes anxious and fears that the car will hit the vehicle in front of them or that a car behind them will rear-end them.  She has significant anxiety regarding this.  There is no associated headache with these episodes.  It really only happens when she is in a car.  MRI of brain without contrast performed on 05/23/13 was unremarkable.  She was evaluated by Dr. 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  He said she was experiencing micropsia.  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   PAST MEDICAL HISTORY: Past Medical History:  Diagnosis Date   Anxiety    Arthritis    Asthma    Depression    GERD (gastroesophageal reflux disease)    Headache(784.0)     Hypertension     MEDICATIONS: Current Outpatient Medications on File Prior to Visit  Medication Sig Dispense Refill   acyclovir (ZOVIRAX) 400 MG tablet Take 1 tablet (400 mg total) by mouth daily.  6   ALPRAZolam (XANAX) 0.5 MG tablet Take 0.5 mg by mouth 3 (three) times daily as needed for anxiety.      amitriptyline (ELAVIL) 75 MG tablet Take 75 mg by mouth every evening.     DULoxetine (CYMBALTA) 60 MG capsule Take 60 mg by mouth daily.     etodolac (LODINE) 400 MG tablet Take 400 mg by mouth daily as needed (for pain/inflammation).      promethazine (PHENERGAN) 25 MG tablet Take 25 mg by mouth daily as needed (for migraine-related nausea).      propranolol (INDERAL) 20 MG tablet Take 1.5 tablets (30 mg total) by mouth 2 (two) times daily. 90 tablet 5   SUMAtriptan 6 MG/0.5ML SOAJ INJECT 0.5 ML INTO SKIN AT EARLIEST ONSET OF HEADACHE. MAY REPET ONCE IN 2 HOURS IF HEADACHE PERSIST 2 Syringe 3   traMADol (ULTRAM) 50 MG tablet Take 50 mg by mouth every 6 (six) hours as needed (for migraine-related pain).      No current facility-administered medications on file prior to visit.    ALLERGIES: Allergies  Allergen Reactions   Amoxicillin Itching    Vaginal itching/yeast infection   Amoxicillin Other (See Comments)    Results in yeast infections if taken    Lisinopril     Facial swelling    FAMILY HISTORY: Family History  Problem Relation Age of Onset   Depression Mother       Objective:  *** General: No acute distress.  Patient appears well-groomed.   Head:  Normocephalic/atraumatic Eyes:  Fundi examined but not visualized Neck: supple, no paraspinal tenderness, full range of motion Heart:  Regular rate and rhythm Lungs:  Clear to auscultation bilaterally Back: No paraspinal tenderness Neurological Exam: ***   Shon Millet, DO  CC: Elsworth Soho, MD

## 2022-11-21 ENCOUNTER — Ambulatory Visit: Payer: Self-pay | Admitting: Neurology

## 2022-11-21 ENCOUNTER — Encounter: Payer: Self-pay | Admitting: Neurology

## 2024-04-12 ENCOUNTER — Telehealth: Payer: Self-pay | Admitting: Neurology

## 2024-04-12 NOTE — Telephone Encounter (Signed)
[phone]  

## 2024-04-12 NOTE — Telephone Encounter (Signed)
 Left a message with after hour service on  04-11-24 t 4:36 pm  Caller is Dr Charlie Croak  OBGYN  calling about a mutual patient and has questions about hormone treatment and migriaine hx
